# Patient Record
Sex: Female | Born: 1955 | Race: White | Hispanic: No | Smoking: Former smoker
Health system: Southern US, Community
[De-identification: ages and names within clinical notes are randomized; demographics above are authoritative.]

## PROBLEM LIST (undated history)

## (undated) DIAGNOSIS — I5030 Unspecified diastolic (congestive) heart failure: Secondary | ICD-10-CM

## (undated) DIAGNOSIS — M47814 Spondylosis without myelopathy or radiculopathy, thoracic region: Secondary | ICD-10-CM

## (undated) DIAGNOSIS — Z720 Tobacco use: Secondary | ICD-10-CM

## (undated) DIAGNOSIS — I2510 Atherosclerotic heart disease of native coronary artery without angina pectoris: Secondary | ICD-10-CM

## (undated) DIAGNOSIS — F32A Depression, unspecified: Secondary | ICD-10-CM

## (undated) DIAGNOSIS — R911 Solitary pulmonary nodule: Secondary | ICD-10-CM

## (undated) DIAGNOSIS — G2581 Restless legs syndrome: Secondary | ICD-10-CM

## (undated) DIAGNOSIS — F419 Anxiety disorder, unspecified: Secondary | ICD-10-CM

## (undated) DIAGNOSIS — I10 Essential (primary) hypertension: Secondary | ICD-10-CM

## (undated) DIAGNOSIS — G4700 Insomnia, unspecified: Secondary | ICD-10-CM

## (undated) DIAGNOSIS — I350 Nonrheumatic aortic (valve) stenosis: Secondary | ICD-10-CM

## (undated) DIAGNOSIS — I422 Other hypertrophic cardiomyopathy: Secondary | ICD-10-CM

## (undated) DIAGNOSIS — M797 Fibromyalgia: Secondary | ICD-10-CM

## (undated) DIAGNOSIS — J449 Chronic obstructive pulmonary disease, unspecified: Secondary | ICD-10-CM

## (undated) DIAGNOSIS — E119 Type 2 diabetes mellitus without complications: Secondary | ICD-10-CM

## (undated) DIAGNOSIS — I779 Disorder of arteries and arterioles, unspecified: Secondary | ICD-10-CM

## (undated) DIAGNOSIS — I639 Cerebral infarction, unspecified: Secondary | ICD-10-CM

## (undated) DIAGNOSIS — R569 Unspecified convulsions: Secondary | ICD-10-CM

## (undated) DIAGNOSIS — I2699 Other pulmonary embolism without acute cor pulmonale: Secondary | ICD-10-CM

## (undated) DIAGNOSIS — E785 Hyperlipidemia, unspecified: Secondary | ICD-10-CM

## (undated) HISTORY — PX: BACK SURGERY: SHX140

## (undated) HISTORY — PX: CHOLECYSTECTOMY: SHX55

## (undated) HISTORY — PX: TUBAL LIGATION: SHX77

---

## 2020-09-04 ENCOUNTER — Emergency Department: Payer: Medicare Other

## 2020-09-04 ENCOUNTER — Inpatient Hospital Stay
Admission: EM | Admit: 2020-09-04 | Discharge: 2020-09-08 | DRG: 070 | Disposition: A | Discharge status: Home Health | Payer: Medicare Other | Attending: Internal Medicine | Admitting: Internal Medicine

## 2020-09-04 ENCOUNTER — Other Ambulatory Visit: Payer: Self-pay

## 2020-09-04 DIAGNOSIS — R42 Dizziness and giddiness: Secondary | ICD-10-CM

## 2020-09-04 DIAGNOSIS — R4182 Altered mental status, unspecified: Secondary | ICD-10-CM

## 2020-09-04 DIAGNOSIS — Z888 Allergy status to other drugs, medicaments and biological substances status: Secondary | ICD-10-CM

## 2020-09-04 DIAGNOSIS — G9389 Other specified disorders of brain: Secondary | ICD-10-CM | POA: Diagnosis present

## 2020-09-04 DIAGNOSIS — J449 Chronic obstructive pulmonary disease, unspecified: Secondary | ICD-10-CM | POA: Diagnosis present

## 2020-09-04 DIAGNOSIS — F419 Anxiety disorder, unspecified: Secondary | ICD-10-CM | POA: Diagnosis present

## 2020-09-04 DIAGNOSIS — I6623 Occlusion and stenosis of bilateral posterior cerebral arteries: Secondary | ICD-10-CM | POA: Diagnosis present

## 2020-09-04 DIAGNOSIS — J9601 Acute respiratory failure with hypoxia: Secondary | ICD-10-CM | POA: Diagnosis present

## 2020-09-04 DIAGNOSIS — Z881 Allergy status to other antibiotic agents status: Secondary | ICD-10-CM

## 2020-09-04 DIAGNOSIS — I6601 Occlusion and stenosis of right middle cerebral artery: Secondary | ICD-10-CM | POA: Diagnosis present

## 2020-09-04 DIAGNOSIS — I110 Hypertensive heart disease with heart failure: Secondary | ICD-10-CM | POA: Diagnosis present

## 2020-09-04 DIAGNOSIS — Z7902 Long term (current) use of antithrombotics/antiplatelets: Secondary | ICD-10-CM

## 2020-09-04 DIAGNOSIS — I2510 Atherosclerotic heart disease of native coronary artery without angina pectoris: Secondary | ICD-10-CM | POA: Diagnosis present

## 2020-09-04 DIAGNOSIS — Z8249 Family history of ischemic heart disease and other diseases of the circulatory system: Secondary | ICD-10-CM

## 2020-09-04 DIAGNOSIS — G9340 Encephalopathy, unspecified: Secondary | ICD-10-CM | POA: Diagnosis present

## 2020-09-04 DIAGNOSIS — I5032 Chronic diastolic (congestive) heart failure: Secondary | ICD-10-CM | POA: Diagnosis present

## 2020-09-04 DIAGNOSIS — M797 Fibromyalgia: Secondary | ICD-10-CM | POA: Diagnosis present

## 2020-09-04 DIAGNOSIS — E669 Obesity, unspecified: Secondary | ICD-10-CM | POA: Diagnosis present

## 2020-09-04 DIAGNOSIS — Z86711 Personal history of pulmonary embolism: Secondary | ICD-10-CM

## 2020-09-04 DIAGNOSIS — Z833 Family history of diabetes mellitus: Secondary | ICD-10-CM

## 2020-09-04 DIAGNOSIS — I160 Hypertensive urgency: Secondary | ICD-10-CM | POA: Diagnosis present

## 2020-09-04 DIAGNOSIS — Z7982 Long term (current) use of aspirin: Secondary | ICD-10-CM

## 2020-09-04 DIAGNOSIS — Z8673 Personal history of transient ischemic attack (TIA), and cerebral infarction without residual deficits: Secondary | ICD-10-CM

## 2020-09-04 DIAGNOSIS — Z9049 Acquired absence of other specified parts of digestive tract: Secondary | ICD-10-CM

## 2020-09-04 DIAGNOSIS — G40909 Epilepsy, unspecified, not intractable, without status epilepticus: Secondary | ICD-10-CM | POA: Diagnosis present

## 2020-09-04 DIAGNOSIS — I422 Other hypertrophic cardiomyopathy: Secondary | ICD-10-CM | POA: Diagnosis present

## 2020-09-04 DIAGNOSIS — Z794 Long term (current) use of insulin: Secondary | ICD-10-CM

## 2020-09-04 DIAGNOSIS — R509 Fever, unspecified: Secondary | ICD-10-CM

## 2020-09-04 DIAGNOSIS — I10 Essential (primary) hypertension: Secondary | ICD-10-CM | POA: Diagnosis present

## 2020-09-04 DIAGNOSIS — G2581 Restless legs syndrome: Secondary | ICD-10-CM | POA: Diagnosis present

## 2020-09-04 DIAGNOSIS — Z20822 Contact with and (suspected) exposure to covid-19: Secondary | ICD-10-CM | POA: Diagnosis present

## 2020-09-04 DIAGNOSIS — G9341 Metabolic encephalopathy: Secondary | ICD-10-CM | POA: Diagnosis not present

## 2020-09-04 DIAGNOSIS — K5900 Constipation, unspecified: Secondary | ICD-10-CM | POA: Diagnosis not present

## 2020-09-04 DIAGNOSIS — G8929 Other chronic pain: Secondary | ICD-10-CM | POA: Diagnosis present

## 2020-09-04 DIAGNOSIS — R0600 Dyspnea, unspecified: Secondary | ICD-10-CM

## 2020-09-04 DIAGNOSIS — I639 Cerebral infarction, unspecified: Secondary | ICD-10-CM | POA: Diagnosis present

## 2020-09-04 DIAGNOSIS — R29810 Facial weakness: Secondary | ICD-10-CM | POA: Diagnosis present

## 2020-09-04 DIAGNOSIS — F32A Depression, unspecified: Secondary | ICD-10-CM | POA: Diagnosis present

## 2020-09-04 DIAGNOSIS — E119 Type 2 diabetes mellitus without complications: Secondary | ICD-10-CM

## 2020-09-04 DIAGNOSIS — R410 Disorientation, unspecified: Secondary | ICD-10-CM | POA: Diagnosis not present

## 2020-09-04 DIAGNOSIS — E785 Hyperlipidemia, unspecified: Secondary | ICD-10-CM | POA: Diagnosis present

## 2020-09-04 DIAGNOSIS — R079 Chest pain, unspecified: Secondary | ICD-10-CM | POA: Diagnosis not present

## 2020-09-04 DIAGNOSIS — Z885 Allergy status to narcotic agent status: Secondary | ICD-10-CM

## 2020-09-04 DIAGNOSIS — E1169 Type 2 diabetes mellitus with other specified complication: Secondary | ICD-10-CM

## 2020-09-04 DIAGNOSIS — Z87891 Personal history of nicotine dependence: Secondary | ICD-10-CM

## 2020-09-04 DIAGNOSIS — I472 Ventricular tachycardia: Secondary | ICD-10-CM | POA: Diagnosis present

## 2020-09-04 DIAGNOSIS — Z6831 Body mass index (BMI) 31.0-31.9, adult: Secondary | ICD-10-CM

## 2020-09-04 DIAGNOSIS — I350 Nonrheumatic aortic (valve) stenosis: Secondary | ICD-10-CM | POA: Diagnosis present

## 2020-09-04 DIAGNOSIS — E781 Pure hyperglyceridemia: Secondary | ICD-10-CM

## 2020-09-04 DIAGNOSIS — I493 Ventricular premature depolarization: Secondary | ICD-10-CM | POA: Diagnosis present

## 2020-09-04 DIAGNOSIS — M549 Dorsalgia, unspecified: Secondary | ICD-10-CM | POA: Diagnosis present

## 2020-09-04 DIAGNOSIS — R0989 Other specified symptoms and signs involving the circulatory and respiratory systems: Secondary | ICD-10-CM | POA: Diagnosis present

## 2020-09-04 DIAGNOSIS — R569 Unspecified convulsions: Secondary | ICD-10-CM

## 2020-09-04 HISTORY — DX: Tobacco use: Z72.0

## 2020-09-04 HISTORY — DX: Other pulmonary embolism without acute cor pulmonale: I26.99

## 2020-09-04 HISTORY — DX: Anxiety disorder, unspecified: F41.9

## 2020-09-04 HISTORY — DX: Type 2 diabetes mellitus without complications: E11.9

## 2020-09-04 HISTORY — DX: Chronic obstructive pulmonary disease, unspecified: J44.9

## 2020-09-04 HISTORY — DX: Fibromyalgia: M79.7

## 2020-09-04 HISTORY — DX: Solitary pulmonary nodule: R91.1

## 2020-09-04 HISTORY — DX: Unspecified diastolic (congestive) heart failure: I50.30

## 2020-09-04 HISTORY — DX: Spondylosis without myelopathy or radiculopathy, thoracic region: M47.814

## 2020-09-04 HISTORY — DX: Atherosclerotic heart disease of native coronary artery without angina pectoris: I25.10

## 2020-09-04 HISTORY — DX: Cerebral infarction, unspecified: I63.9

## 2020-09-04 HISTORY — DX: Essential (primary) hypertension: I10

## 2020-09-04 HISTORY — DX: Nonrheumatic aortic (valve) stenosis: I35.0

## 2020-09-04 HISTORY — DX: Hyperlipidemia, unspecified: E78.5

## 2020-09-04 HISTORY — DX: Other hypertrophic cardiomyopathy: I42.2

## 2020-09-04 HISTORY — DX: Unspecified convulsions: R56.9

## 2020-09-04 HISTORY — DX: Disorder of arteries and arterioles, unspecified: I77.9

## 2020-09-04 HISTORY — DX: Morbid (severe) obesity due to excess calories: E66.01

## 2020-09-04 HISTORY — DX: Depression, unspecified: F32.A

## 2020-09-04 HISTORY — DX: Restless legs syndrome: G25.81

## 2020-09-04 HISTORY — DX: Insomnia, unspecified: G47.00

## 2020-09-04 LAB — CBC
HCT: 40.5 % (ref 36.0–46.0)
Hemoglobin: 14.4 g/dL (ref 12.0–15.0)
MCH: 32.6 pg (ref 26.0–34.0)
MCHC: 35.6 g/dL (ref 30.0–36.0)
MCV: 91.6 fL (ref 80.0–100.0)
Platelets: 286 10*3/uL (ref 150–400)
RBC: 4.42 MIL/uL (ref 3.87–5.11)
RDW: 12.1 % (ref 11.5–15.5)
WBC: 7.8 10*3/uL (ref 4.0–10.5)
nRBC: 0 % (ref 0.0–0.2)

## 2020-09-04 LAB — TROPONIN I (HIGH SENSITIVITY)
Troponin I (High Sensitivity): 8 ng/L (ref <18)
Troponin I (High Sensitivity): 9 ng/L (ref <18)

## 2020-09-04 LAB — CBG MONITORING, ED
Glucose-Capillary: 121 mg/dL — ABNORMAL HIGH (ref 70–99)
Glucose-Capillary: 132 mg/dL — ABNORMAL HIGH (ref 70–99)

## 2020-09-04 LAB — DIFFERENTIAL
Abs Immature Granulocytes: 0.03 10*3/uL (ref 0.00–0.07)
Basophils Absolute: 0 10*3/uL (ref 0.0–0.1)
Basophils Relative: 1 %
Eosinophils Absolute: 0.1 10*3/uL (ref 0.0–0.5)
Eosinophils Relative: 2 %
Immature Granulocytes: 0 %
Lymphocytes Relative: 31 %
Lymphs Abs: 2.4 10*3/uL (ref 0.7–4.0)
Monocytes Absolute: 0.5 10*3/uL (ref 0.1–1.0)
Monocytes Relative: 7 %
Neutro Abs: 4.7 10*3/uL (ref 1.7–7.7)
Neutrophils Relative %: 59 %

## 2020-09-04 LAB — PROTIME-INR
INR: 1 (ref 0.8–1.2)
Prothrombin Time: 13.1 seconds (ref 11.4–15.2)

## 2020-09-04 LAB — COMPREHENSIVE METABOLIC PANEL
ALT: 27 U/L (ref 0–44)
AST: 23 U/L (ref 15–41)
Albumin: 3.9 g/dL (ref 3.5–5.0)
Alkaline Phosphatase: 38 U/L (ref 38–126)
Anion gap: 8 (ref 5–15)
BUN: 27 mg/dL — ABNORMAL HIGH (ref 8–23)
CO2: 29 mmol/L (ref 22–32)
Calcium: 9.7 mg/dL (ref 8.9–10.3)
Chloride: 102 mmol/L (ref 98–111)
Creatinine, Ser: 0.69 mg/dL (ref 0.44–1.00)
GFR, Estimated: >60 mL/min (ref >60)
Glucose, Bld: 193 mg/dL — ABNORMAL HIGH (ref 70–99)
Potassium: 4.3 mmol/L (ref 3.5–5.1)
Sodium: 139 mmol/L (ref 135–145)
Total Bilirubin: 0.7 mg/dL (ref 0.3–1.2)
Total Protein: 7.5 g/dL (ref 6.5–8.1)

## 2020-09-04 LAB — URINALYSIS, ROUTINE W REFLEX MICROSCOPIC
Bilirubin Urine: NEGATIVE
Glucose, UA: NEGATIVE mg/dL
Hgb urine dipstick: NEGATIVE
Ketones, ur: NEGATIVE mg/dL
Nitrite: NEGATIVE
Protein, ur: 100 mg/dL — AB
Specific Gravity, Urine: 1.027 (ref 1.005–1.030)
pH: 5 (ref 5.0–8.0)

## 2020-09-04 LAB — URINE DRUG SCREEN, QUALITATIVE (ARMC ONLY)
Amphetamines, Ur Screen: NOT DETECTED
Barbiturates, Ur Screen: NOT DETECTED
Benzodiazepine, Ur Scrn: NOT DETECTED
Cannabinoid 50 Ng, Ur ~~LOC~~: NOT DETECTED
Cocaine Metabolite,Ur ~~LOC~~: NOT DETECTED
MDMA (Ecstasy)Ur Screen: NOT DETECTED
Methadone Scn, Ur: NOT DETECTED
Opiate, Ur Screen: NOT DETECTED
Phencyclidine (PCP) Ur S: NOT DETECTED
Tricyclic, Ur Screen: POSITIVE — AB

## 2020-09-04 LAB — I-STAT CREATININE, ED: Creatinine, Ser: 0.8 mg/dL (ref 0.44–1.00)

## 2020-09-04 LAB — RESP PANEL BY RT-PCR (FLU A&B, COVID) ARPGX2
Influenza A by PCR: NEGATIVE
Influenza B by PCR: NEGATIVE
SARS Coronavirus 2 by RT PCR: NEGATIVE

## 2020-09-04 LAB — APTT: aPTT: 30 seconds (ref 24–36)

## 2020-09-04 LAB — ETHANOL: Alcohol, Ethyl (B): <10 mg/dL (ref <10)

## 2020-09-04 MED ORDER — ACETAMINOPHEN 650 MG RE SUPP: 650.0000 mg | Freq: Four times a day (QID) | RECTAL | Status: DC | PRN

## 2020-09-04 MED ORDER — ACETAMINOPHEN 325 MG PO TABS
650.0000 mg | ORAL_TABLET | Freq: Four times a day (QID) | ORAL | Status: DC | PRN
  Administered 2020-09-04 – 2020-09-07 (×3): 650 mg via ORAL
  Filled 2020-09-04 (×3): qty 2

## 2020-09-04 MED ORDER — ALBUTEROL SULFATE HFA 108 (90 BASE) MCG/ACT IN AERS
2.0000 | INHALATION_SPRAY | RESPIRATORY_TRACT | Status: DC | PRN
  Filled 2020-09-04: qty 6.7

## 2020-09-04 MED ORDER — MORPHINE SULFATE (PF) 2 MG/ML IV SOLN
2.0000 mg | INTRAVENOUS | Status: DC | PRN
  Administered 2020-09-04: 2 mg via INTRAVENOUS
  Filled 2020-09-04: qty 1

## 2020-09-04 MED ORDER — HYDRALAZINE HCL 20 MG/ML IJ SOLN
5.0000 mg | INTRAMUSCULAR | Status: DC | PRN
  Administered 2020-09-04 – 2020-09-05 (×3): 5 mg via INTRAVENOUS
  Filled 2020-09-04 (×3): qty 1

## 2020-09-04 MED ORDER — LORAZEPAM 2 MG/ML IJ SOLN: 1.0000 mg | INTRAMUSCULAR | Status: DC | PRN

## 2020-09-04 MED ORDER — KETOROLAC TROMETHAMINE 15 MG/ML IJ SOLN
15.0000 mg | Freq: Four times a day (QID) | INTRAMUSCULAR | Status: DC | PRN
  Administered 2020-09-04: 15 mg via INTRAVENOUS
  Filled 2020-09-04 (×4): qty 1

## 2020-09-04 MED ORDER — INSULIN ASPART 100 UNIT/ML IJ SOLN
0.0000 [IU] | Freq: Three times a day (TID) | INTRAMUSCULAR | Status: DC
  Administered 2020-09-04: 1 [IU] via SUBCUTANEOUS
  Administered 2020-09-05 (×2): 3 [IU] via SUBCUTANEOUS
  Administered 2020-09-05: 5 [IU] via SUBCUTANEOUS
  Administered 2020-09-06 (×2): 2 [IU] via SUBCUTANEOUS
  Administered 2020-09-06 – 2020-09-07 (×2): 3 [IU] via SUBCUTANEOUS
  Administered 2020-09-07: 2 [IU] via SUBCUTANEOUS
  Administered 2020-09-07: 1 [IU] via SUBCUTANEOUS
  Administered 2020-09-08 (×2): 2 [IU] via SUBCUTANEOUS
  Filled 2020-09-04 (×11): qty 1

## 2020-09-04 MED ORDER — LABETALOL HCL 5 MG/ML IV SOLN
20.0000 mg | INTRAVENOUS | Status: DC | PRN
  Administered 2020-09-04 – 2020-09-07 (×4): 20 mg via INTRAVENOUS
  Filled 2020-09-04 (×4): qty 4

## 2020-09-04 MED ORDER — INSULIN ASPART 100 UNIT/ML IJ SOLN
0.0000 [IU] | Freq: Every day | INTRAMUSCULAR | Status: DC
  Administered 2020-09-06: 2 [IU] via SUBCUTANEOUS
  Filled 2020-09-04: qty 1

## 2020-09-04 MED ORDER — ONDANSETRON HCL 4 MG/2ML IJ SOLN
4.0000 mg | Freq: Three times a day (TID) | INTRAMUSCULAR | Status: DC | PRN
  Administered 2020-09-05 (×2): 4 mg via INTRAVENOUS
  Filled 2020-09-04 (×2): qty 2

## 2020-09-04 NOTE — ED Provider Notes (Signed)
[facility] Emergency Department Provider Note ____________________________________________   Event Date/Time   First MD Initiated Contact with Patient 09/04/20 1355     (approximate)  I have reviewed the triage vital signs and the nursing notes.  HISTORY  Chief Complaint Code Stroke   HPI Maria Norris is a 65 y.o. femalewho presents to the ED for evaluation of code stroke  Chart review indicates no history in her system.  Patient is reportedly on Plavix and a history of TIA.  DM on insulin.  Daughter later arrives and provides majority of medical history.  She reports a history of stroke and TIA for the patient.  She is on aspirin and Plavix.  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  Majority of her care is through the [facility] healthcare system. Patient lives at home with her daughter for the past couple months.  Daughter reports that they were out to eat at [employer] today for Mother's Day when she suddenly started crying, saying she was confused and that she did not know where she was, she was shaking her bilateral hands and frantically looking around.  Reported a right-sided headache.  Daughter took the patient home and noted hypertensive blood pressures at home with systolics in the 190s. Daughter reports concern for stroke, due to patient's history of such, and so called 911.  EMS reports difficulty having the patient participate with examination, therefore called a stroke alert to our facility.  She presents to the ED as a stroke alert.  History limited upon arrival due to this.  Daughter reports checking blood pressures at home and they are frequently with systolics in the 190s and 200s.  Daughter reports that this is "normal" for the patient.  Daughter reports patient seemed fine yesterday and this morning prior to going to [employer].  No recent  illnesses.   Past Medical History:  Diagnosis Date  . Coronary artery disease   . Diabetes mellitus without complication (HCC)   . Hypertension   . Seizures (HCC)     There are no problems to display for this patient.   Prior to Admission medications   Not on File    Allergies Patient has no known allergies.  No family history on file.  Social History    Review of Systems  Unable to be accurately assessed upon arrival, but unable to acquire adequate history about an hour later after imaging has been completed and stroke alert canceled.  Constitutional: No fever/chills Eyes: No visual changes. ENT: No sore throat. Cardiovascular: Denies chest pain. Respiratory: Denies shortness of breath. Gastrointestinal: No abdominal pain.  No nausea, no vomiting.  No diarrhea.  No constipation. Genitourinary: Negative for dysuria. Musculoskeletal: Negative for back pain. Skin: Negative for rash. Neurological: Negative for headaches, focal weakness or numbness.  ____________________________________________   PHYSICAL EXAM:  VITAL SIGNS: Vitals:   09/04/20 1449 09/04/20 1500  BP:  (!) 195/67  Pulse:  61  Resp:  13  Temp: (!) 97.5 F (36.4 C)   SpO2:  96%     Constitutional: Alert and oriented. Well appearing and in no acute distress.  Supine in bed, slow to respond some questions, but answers questions appropriately and follows commands in all 4 extremities. Eyes: Conjunctivae are normal. PERRL. EOMI. Head: Atraumatic. Nose: No congestion/rhinnorhea. Mouth/Throat: Mucous membranes are moist.  Oropharynx non-erythematous. Neck: No stridor. No cervical spine tenderness to palpation. Cardiovascular: Normal rate, regular  rhythm. Grossly normal heart sounds.  Good peripheral circulation. Respiratory: Normal respiratory effort.  No retractions. Lungs CTAB. Gastrointestinal: Soft , nondistended, nontender to palpation. No CVA tenderness. Musculoskeletal: No lower extremity  tenderness nor edema.  No joint effusions. No signs of acute trauma. Neurologic:  Normal speech and language. No gross focal neurologic deficits are appreciated. Cranial nerves II through XII intact 5/5 strength and sensation in all 4 extremities Skin:  Skin is warm, dry and intact. No rash noted. Psychiatric: Mood and affect are normal. Speech and behavior are normal.  ____________________________________________   LABS (all labs ordered are listed, but only abnormal results are displayed)  Labs Reviewed  COMPREHENSIVE METABOLIC PANEL - Abnormal; Notable for the following components:      Result Value   Glucose, Bld 193 (*)    BUN 27 (*)    All other components within normal limits  RESP PANEL BY RT-PCR (FLU A&B, COVID) ARPGX2  PROTIME-INR  APTT  CBC  DIFFERENTIAL  ETHANOL  URINE DRUG SCREEN, QUALITATIVE ([facility] ONLY)  URINALYSIS, ROUTINE W REFLEX MICROSCOPIC  I-STAT CREATININE, ED   ____________________________________________  12 Lead EKG  Sinus rhythm, rate of 65 bpm.  Normal axis and normal intervals.  Some submillimeter upsloping ST elevation diffusely without reciprocal features.  No evidence of STEMI.  No comparison.  Likely stigmata of hypertension ____________________________________________  RADIOLOGY  ED MD interpretation: CT head reviewed by me without evidence of acute intracranial pathology.  Official radiology report(s): MR ANGIO HEAD WO CONTRAST  Result Date: 09/04/2020 CLINICAL DATA:  Sudden onset of confusion and occipital headache. EXAM: MRI HEAD WITHOUT CONTRAST MRA HEAD WITHOUT CONTRAST TECHNIQUE: Multiplanar, multiecho pulse sequences of the brain and surrounding structures were obtained without intravenous contrast. Angiographic images of the head were obtained using MRA technique without contrast. COMPARISON:  Head CT earlier same day FINDINGS: MRI HEAD FINDINGS Brain: Diffusion imaging does not show any acute or subacute infarction. There is old  infarction affecting the left side of the upper pons. No focal cerebellar finding. Cerebral hemispheres show old infarction in the deep white matter of the left hemisphere with some gliosis and volume loss. Few old small vessel infarctions in the right hemispheric white matter. No cortical or large vessel territory infarction. No mass lesion, hemorrhage, hydrocephalus or extra-axial collection. Minimal hemosiderin deposition in old left deep white matter infarction posteriorly. Vascular: Major vessels at the base of the brain show flow. Skull and upper cervical spine: Negative Sinuses/Orbits: Clear/normal Other: Bilateral mastoid effusions. MRA HEAD FINDINGS Both internal carotid arteries are widely patent through the skull base and siphon regions. The anterior and middle cerebral vessels are patent. There are stenoses at the right MCA bifurcation and proximal M2 segments which could place the patient at risk of MCA territory infarction on the right. Right vertebral artery is widely patent to the basilar. No antegrade flow in the left vertebral artery. No basilar stenosis. Flow is present within both superior cerebellar arteries and both posterior cerebral arteries. Narrowing and irregularity of the PCA branches, left worse than right. IMPRESSION: No acute or subacute brain infarction. 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 MR angiography does not large vessel anterior circulation occlusion. There are stenoses at the right MCA bifurcation, which could place the patient at risk of right MCA branch vessel infarction. No antegrade flow in the left vertebral artery. Atherosclerotic narrowing of the PCA branches, left more than right Electronically Signed   By: Loraine LericheMark  Shogry M.D.   On: 09/04/2020 14:40   MR BRAIN WO CONTRAST  Result Date: 09/04/2020 CLINICAL DATA:  Sudden onset of confusion and occipital headache. EXAM: MRI HEAD WITHOUT CONTRAST MRA HEAD WITHOUT CONTRAST  TECHNIQUE: Multiplanar, multiecho pulse sequences of the brain and surrounding structures were obtained without intravenous contrast. Angiographic images of the head were obtained using MRA technique without contrast. COMPARISON:  Head CT earlier same day FINDINGS: MRI HEAD FINDINGS Brain: Diffusion imaging does not show any acute or subacute infarction. There is old infarction affecting the left side of the upper pons. No focal cerebellar finding. Cerebral hemispheres show old infarction in the deep white matter of the left hemisphere with some gliosis and volume loss. Few old small vessel infarctions in the right hemispheric white matter. No cortical or large vessel territory infarction. No mass lesion, hemorrhage, hydrocephalus or extra-axial collection. Minimal hemosiderin deposition in old left deep white matter infarction posteriorly. Vascular: Major vessels at the base of the brain show flow. Skull and upper cervical spine: Negative Sinuses/Orbits: Clear/normal Other: Bilateral mastoid effusions. MRA HEAD FINDINGS Both internal carotid arteries are widely patent through the skull base and siphon regions. The anterior and middle cerebral vessels are patent. There are stenoses at the right MCA bifurcation and proximal M2 segments which could place the patient at risk of MCA territory infarction on the right. Right vertebral artery is widely patent to the basilar. No antegrade flow in the left vertebral artery. No basilar stenosis. Flow is present within both superior cerebellar arteries and both posterior cerebral arteries. Narrowing and irregularity of the PCA branches, left worse than right. IMPRESSION: No acute or subacute brain infarction. 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 MR angiography does not large vessel anterior circulation occlusion. There are stenoses at the right MCA bifurcation, which could place the patient at risk of right MCA branch vessel  infarction. No antegrade flow in the left vertebral artery. Atherosclerotic narrowing of the PCA branches, left more than right Electronically Signed   By: Paulina Fusi M.D.   On: 09/04/2020 14:40   CT HEAD CODE STROKE WO CONTRAST`  Result Date: 09/04/2020 CLINICAL DATA:  Code stroke. Neuro deficit, acute, stroke suspected. Sudden onset of headache. Personal history of stroke and TIA. EXAM: CT HEAD WITHOUT CONTRAST TECHNIQUE: Contiguous axial images were obtained from the base of the skull through the vertex without intravenous contrast. COMPARISON:  None. FINDINGS: Brain: Subcortical white matter changes are more prominent left than right. No acute infarct, hemorrhage, or mass lesion is present. Basal ganglia are intact. Insular ribbon is normal. No acute or focal cortical abnormality is present. Posterior fossa arachnoid cyst noted. The brainstem and cerebellum are otherwise normal. The ventricles are of normal size. No significant extraaxial fluid collection is present. Vascular: Atherosclerotic changes are present within the cavernous internal carotid arteries bilaterally. Calcifications are also present at the dural margin of the vertebral arteries. No hyperdense vessel is present. Skull: Calvarium is intact. No focal lytic or blastic lesions are present. No significant extracranial soft tissue lesion is present. Sinuses/Orbits: The paranasal sinuses and mastoid air cells are clear. The globes and orbits are within normal limits. ASPECTS [facility] Stroke Program Early CT Score) - Ganglionic level infarction (caudate, lentiform nuclei, internal capsule, insula, M1-M3 cortex): 7/7 - Supraganglionic infarction (M4-M6 cortex): 3/3 Total score (0-10 with 10 being normal): 10/10 IMPRESSION: 1. Subcortical white matter changes are more prominent left than right. This likely reflects  the sequela of chronic microvascular ischemia. 2. No acute intracranial abnormality. 3. ASPECTS is 10/10. These results were called by  telephone at the time of interpretation on 09/04/2020 at 2:07 pm to provider Dr. Iver Nestle , who verbally acknowledged these results. Electronically Signed   By: Marin Roberts M.D.   On: 09/04/2020 14:08    ____________________________________________   PROCEDURES and INTERVENTIONS  Procedure(s) performed (including Critical Care):  .1-3 Lead EKG Interpretation Performed by: Delton Prairie, MD Authorized by: Delton Prairie, MD     Interpretation: normal     ECG rate:  62   ECG rate assessment: normal     Rhythm: sinus rhythm     Ectopy: none     Conduction: normal      Medications - No data to display  ____________________________________________   MDM / ED COURSE   65 year old woman with history of stroke presents to the ED after resolving episode of transient altered mentation of uncertain etiology.  Presents hypertensive, but daughter reports these are typical blood pressures for the patient.  After imaging, I am able to more thoroughly assess the patient and I see no evidence of focal neurologic deficits, distress, trauma or any signs of vascular deficits.  Her MR and CT imaging are unremarkable, and without evidence of acute CVA.  Discussed the case with neurology, who recommends medical admission for observation, EEG.  Uncertain if this represents episode of transient global amnesia, TIA.  We will discussed the case with hospitalist for admission     ____________________________________________   FINAL CLINICAL IMPRESSION(S) / ED DIAGNOSES  Final diagnoses:  Altered mental status, unspecified altered mental status type     ED Discharge Orders    None       Breanda Greenlaw Katrinka Blazing   Note:  This document was prepared using Dragon voice recognition software and may include unintentional dictation errors.   Delton Prairie, MD 09/04/20 [phone]

## 2020-09-04 NOTE — ED Notes (Signed)
Patient noted to be resting comfortably, rise and fall of chest noted.  Sister bedside assisting with registration.

## 2020-09-04 NOTE — ED Triage Notes (Signed)
Pt was at [employer] with family when she complained of a sudden headache and weakness in her limbs- pt also had a sudden onset of confusion- pt has a hx of strokes

## 2020-09-04 NOTE — Progress Notes (Signed)
CODE STROKE- PHARMACY COMMUNICATION   Time CODE STROKE called/page received:1343  Time response to CODE STROKE was made (in person or via phone): 1359  Time Stroke Kit retrieved from [place] (only if needed): not required  Name of Provider/Nurse contacted:D Tamala Julian, MD  No past medical history on file. Prior to Admission medications   Not on [place] ,PharmD Clinical Pharmacist  09/04/2020  2:01 PM

## 2020-09-04 NOTE — ED Notes (Signed)
Report received from Ariel, RN 

## 2020-09-04 NOTE — Consult Note (Signed)
Neurology Consultation Reason for Consult: Code stroke for acute onset confusion and possible right-sided weakness Requesting Physician: Delton Prairie  CC: Confusion  History is obtained from: Sister at bedside, patient cannot be located in EMR at this time  HPI: Maria Norris is a 65 y.o. female with a past medical history significant for prior stroke, poorly controlled hypertension (baseline blood pressures at home typically in the 200s), reportedly controlled diabetes with A1c close to 7%, smoking history quit 1 week ago, likely hyperlipidemia, recent right carotid endarterectomy (September), chronic back pain and lower extremity pain s/p back surgery in 2017.  She was celebrating Mother's Day with her family and [employer] and had finished eating her meal.  She acutely started crying and was confused about where she was and reported she was not feeling well and was having a headache.  She had some anxious shaking of her bilateral upper extremities but no concern for seizure-like activity.  Family initially took the patient home and reported her blood pressure was 137/107 on home monitor.  Due to worsening confusion and reducing responsiveness they were planning to take the patient to [facility], where she has a neurologist, but she began to slump over and therefore they activated EMS.  Per EMS her blood pressure was 129/93 on their arrival, she was satting 94% on room air and her glucose was in 300s.  She was leaning towards the right when they were walking her towards their ambulance and oriented to self only.  Code stroke was activated for concern for focal right-sided weakness.  She has only recently moved in with her sister, and is awaiting a primary care physician appointment in the [facility] system in August  LKW: 1300 tPA given?: No, exam with low clinical concern for stroke, confirmed on imaging given patient's limited participation Premorbid modified rankin scale: 1-2     1 - No significant  disability. Able to carry out all usual activities, despite some symptoms.     2 - Slight disability. Able to look after own affairs without assistance, but unable to carry out all previous activities.   ROS: Unable to obtain due to altered mental status.  Per sister, no acute concerns other than described above  Past Medical History:  Diagnosis Date  . Coronary artery disease   . Diabetes mellitus without complication (HCC)   . Hypertension   . Seizures (HCC)    Please see history above as provided by sister  No family history on file. Unable to confirm with patient given her mental status  Social History:  has no history on file for tobacco use, alcohol use, and drug use. Known tobacco use  Exam: Current vital signs: BP (!) 199/74 (BP Location: Right Arm)   Pulse 66   Temp (!) 97.5 F (36.4 C) (Oral)   Resp 18   Ht 5\' 2"  (1.575 m)   Wt 79.2 kg   SpO2 97%   BMI 31.92 kg/m  Vital signs in last 24 hours: Temp:  [97.5 F (36.4 C)] 97.5 F (36.4 C) (05/08 1449) Pulse Rate:  [66] 66 (05/08 1445) Resp:  [18] 18 (05/08 1445) BP: (199)/(74) 199/74 (05/08 1445) SpO2:  [97 %] 97 % (05/08 1445) Weight:  [79.2 kg] 79.2 kg (05/08 1445)   Physical Exam  Constitutional: Appears well-developed and well-nourished.  Central obesity Psych: Anxious, hypersensitive to stimuli (for example crying out in pain from stickers being removed). Eyes: No scleral injection HENT: No oropharyngeal obstruction.  MSK: no joint deformities.  Cardiovascular: Normal rate and regular rhythm.  Respiratory: Effort normal, tachypneic GI: Soft.  No distension. There is no tenderness.  Skin: Warm dry and intact visible skin  Neuro: Mental Status: Patient is awake, alert, able to follow simple commands intermittently.  Able to name and repeat.  Knows she is 25 but does not answer what month or year it is.   Cranial Nerves: II: Visual Fields are full. Pupils are equal, round, and reactive to light.    III,IV, VI: EOMI without ptosis or diploplia.  No significant nystagmus V: Facial sensation is symmetric to eyelash brush VII: Facial movement is symmetric on repeat exam, initially possible slight right facial droop VIII: hearing is intact to voice X: Uvula elevates symmetrically XI: Unable to assess secondary to patient's mental status /participation XII: tongue is midline without atrophy or fasciculations.  Motor: Tone is normal. Bulk is normal.  There is no pronator drift in the bilateral upper extremities.  She initially was moving the right leg less than the left leg, but then later was spontaneously moving the right leg 2/5.  At best she would very briefly resist gravity before the leg the bed.  Overall both legs were fairly symmetric and seemed effort limited Sensory: Equally reactive to touch in all 4 extremities.  Generally was reporting feeling strange in response to sensation testing but not with any focality Deep Tendon Reflexes: 2+ and symmetric in the biceps and patellae.  Plantars: Toes are mute on the right and upgoing on left Cerebellar: Finger-nose is intact bilaterally in the upper extremities.  Patient would not participate with lower extremity testing  NIHSS total 7 Score breakdown: 1 point for right facial droop, 2 points for left leg drift, 2 points for right leg drift, one-point for dysarthria  I have reviewed labs in epic and the results pertinent to this consultation are: Creatinine 0.8 (i-STAT) CBC is reassuring and within normal limits CMP, UA and UDS pending at the time of this evaluation  I have reviewed the images obtained: Head CT negative for acute intracranial process no significant microvascular disease, worse on the left than the right MRI brain negative for acute intracranial process MRA with no flow in the left vertebral artery, scattered intracranial atherosclerosis  Impression: 65 year old presenting with an acute onset confusional episode.   Given her significant stroke risk factors advanced imaging was pursued to definitively rule out acute stroke.  Additional work-up per ED.  Overall, low concern for seizure at this time but if no other etiology of her symptoms is found TGA should be considered and would therefore recommend admission for observation and routine EEG on 5/9.  Recommendations: -MRI brain and MRA completed on my recommendations as above  -Follow-up pending labs above -Appreciate excellent supportive care per ED -Should work-up be otherwise unrevealing, please admit for observation/EEG  Please at the time of this evaluation, patient's family is still working on finding patient's full medication list and we have been unable to access this patient's records in the care everywhere EMR for corroboration.  Brooke Dare MD-PhD [facility] [phone] [facility] coverage for [facility] is from 8 AM to 4 AM in-house and 4 PM to 8 PM by telephone/video. 8 PM to 8 AM emergent questions or overnight urgent questions should be addressed to Teleneurology On-call or [facility] neurohospitalist; contact information can be found on AMION

## 2020-09-04 NOTE — H&P (Signed)
History and Physical    Karthika Glasper GNF:[id number] DOB: [birth year] DOA: 09/04/2020  Referring MD/NP/PA:   PCP: Pcp, No   Patient coming from:  The patient is coming from home.  At baseline, pt is independent for most of ADL.        Chief Complaint: AMS  HPI: Maria Norris is a 65 y.o. female with medical history significant of stroke, hypertension, hyperlipidemia, diabetes mellitus, stroke, childhood seizure (did not have seizure since age of 72), CAD, former smoker (quit smoking 1 week ago), bilateral carotid artery stenosis (s/p of right carotid endarterectomy 12/2019), chronic back pain, who presents with altered mental status.  Per patient's sister at the bedside, they were out to eat at [employer] today for Mother's Day when pt suddenly became confused, not knowing where she is and started crying, with decreased responsiveness. She was shaking bilateral hands, but did not seem to have seizure. She has right-sided headache. She was leaning toward right side when waling. He sister concerned for stroke, and so called 911.  EMS reports difficulty having the patient participate with examination, therefore called a stroke alert to our facility.   Per her sister, pt's blood pressure has been poorly controlled, SBP is frequently up to 190-200s.  Patient's mental status improved in ED.  When I saw patient, patient is alert, oriented x3.  Her mental status is back to baseline.  No unilateral numbness or tingling in extremities.  No facial droop or slurred speech.  Patient reports mild chest pain, which is located in the substernal area, dull, nonradiating.  Associated with mild shortness of breath earlier. No cough, fever or chills.  No nausea vomiting, diarrhea or abdominal pain.  No symptoms of UTI.  Patient states that she quit smoking about 1 week ago.  She does not need nicotine patch today.   ED Course: pt was found to have WBC 7.8, pending COVID-19 PCR, alcohol level less than 10,  electrolytes renal function okay, INR 1.0, PTT 30, temperature normal, blood pressure 199/74, heart rate is 86, RR 18, oxygen saturation 98% on room air.  CT head is negative for acute intracranial abnormalities.  MRI is negative for acute stroke, and it showed old left pons stroke.  MRA is negative for LVO.  Patient is placed on progressive plan for observation.  MRI/MRA: No acute or subacute brain infarction. 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  MR angiography does not large vessel anterior circulation occlusion. There are stenoses at the right MCA bifurcation, which could place the patient at risk of right MCA branch vessel infarction.  No antegrade flow in the left vertebral artery. Atherosclerotic narrowing of the PCA branches, left more than right   CT-head: 1. Subcortical white matter changes are more prominent left than right. This likely reflects the sequela of chronic microvascular ischemia. 2. No acute intracranial abnormality.  Review of Systems:   General: no fevers, chills, no body weight gain,  has fatigue HEENT: no blurry vision, hearing changes or sore throat Respiratory: has dyspnea, no coughing, wheezing CV: has chest pain, no palpitations GI: no nausea, vomiting, abdominal pain, diarrhea, constipation GU: no dysuria, burning on urination, increased urinary frequency, hematuria  Ext: no leg edema Neuro: no unilateral weakness, numbness, or tingling, no vision change or hearing loss. Has AMS. Skin: no rash, no skin tear. MSK: No muscle spasm, no deformity, no limitation of range of movement in spin Heme: No easy bruising.  Travel history: No recent long distant travel.  Allergy:  Allergies  Allergen Reactions  . Erythromycin Other (See Comments) and Rash    Unknown  Unknown    . Codeine   . Paroxetine   . Sertraline Other (See Comments)    Suicidal thoughts Suicidal thoughts   . Wellbutrin [Bupropion]    . Buspirone Rash    Past Medical History:  Diagnosis Date  . Coronary artery disease   . Diabetes mellitus without complication (HCC)   . HLD (hyperlipidemia)   . Hypertension   . Seizures (HCC)     Past Surgical History:  Procedure Laterality Date  . BACK SURGERY    . CHOLECYSTECTOMY    . TUBAL LIGATION      Social History:  reports that she has quit smoking. She has never used smokeless tobacco. She reports that she does not drink alcohol and does not use drugs.  Family History:  Family History  Problem Relation Age of Onset  . Diabetes Mellitus II Father   . CAD Father   . Diabetes Mellitus II Sister   . CAD Sister      Prior to Admission medications   Not on File    Physical Exam: Vitals:   09/04/20 1449 09/04/20 1500 09/04/20 1530 09/04/20 1600  BP:  (!) 195/67 (!) 173/65 (!) 174/60  Pulse:  61 63 (!) 59  Resp:  13 19 18   Temp: (!) 97.5 F (36.4 C)     TempSrc: Oral     SpO2:  96% 95% 96%  Weight:      Height:       General: Not in acute distress HEENT:       Eyes: PERRL, EOMI, no scleral icterus.       ENT: No discharge from the ears and nose, no pharynx injection, no tonsillar enlargement.        Neck: No JVD, no bruit, no mass felt. Heme: No neck lymph node enlargement. Cardiac: S1/S2, RRR, 2/6 systolic murmurs, No gallops or rubs. Respiratory: No rales, wheezing, rhonchi or rubs. GI: Soft, nondistended, nontender, no rebound pain, no organomegaly, BS present. GU: No hematuria Ext: No pitting leg edema bilaterally. 1+DP/PT pulse bilaterally. Musculoskeletal: No joint deformities, No joint redness or warmth, no limitation of ROM in spin. Skin: No rashes.  Neuro: Alert, oriented X3, cranial nerves II-XII grossly intact, moves all extremities normally. Psych: Patient is not psychotic, no suicidal or hemocidal ideation.  Labs on Admission: I have personally reviewed following labs and imaging studies  CBC: Recent Labs  Lab 09/04/20 1453  WBC  7.8  NEUTROABS 4.7  HGB 14.4  HCT 40.5  MCV 91.6  PLT 286   Basic Metabolic Panel: Recent Labs  Lab 09/04/20 1405 09/04/20 1453  NA  --  139  K  --  4.3  CL  --  102  CO2  --  29  GLUCOSE  --  193*  BUN  --  27*  CREATININE 0.80 0.69  CALCIUM  --  9.7   GFR: Estimated Creatinine Clearance: 70.1 mL/min (by C-G formula based on SCr of 0.69 mg/dL). Liver Function Tests: Recent Labs  Lab 09/04/20 1453  AST 23  ALT 27  ALKPHOS 38  BILITOT 0.7  PROT 7.5  ALBUMIN 3.9   No results for input(s): LIPASE, AMYLASE in the last 168 hours. No results for input(s): AMMONIA in the last 168 hours. Coagulation Profile: Recent Labs  Lab 09/04/20 1453  INR 1.0  Cardiac Enzymes: No results for input(s): CKTOTAL, CKMB, CKMBINDEX, TROPONINI in the last 168 hours. BNP (last 3 results) No results for input(s): PROBNP in the last 8760 hours. HbA1C: No results for input(s): HGBA1C in the last 72 hours. CBG: No results for input(s): GLUCAP in the last 168 hours. Lipid Profile: No results for input(s): CHOL, HDL, LDLCALC, TRIG, CHOLHDL, LDLDIRECT in the last 72 hours. Thyroid Function Tests: No results for input(s): TSH, T4TOTAL, FREET4, T3FREE, THYROIDAB in the last 72 hours. Anemia Panel: No results for input(s): VITAMINB12, FOLATE, FERRITIN, TIBC, IRON, RETICCTPCT in the last 72 hours. Urine analysis: No results found for: COLORURINE, APPEARANCEUR, LABSPEC, PHURINE, GLUCOSEU, HGBUR, BILIRUBINUR, KETONESUR, PROTEINUR, UROBILINOGEN, NITRITE, LEUKOCYTESUR Sepsis Labs: @LABRCNTIP (procalcitonin:4,lacticidven:4) ) Recent Results (from the past 240 hour(s))  Resp Panel by RT-PCR (Flu A&B, Covid) Nasopharyngeal Swab     Status: None   Collection Time: 09/04/20  2:53 PM   Specimen: Nasopharyngeal Swab; Nasopharyngeal(NP) swabs in vial transport medium  Result Value Ref Range Status   SARS Coronavirus 2 by RT PCR NEGATIVE NEGATIVE Final    Comment: (NOTE) SARS-CoV-2 target nucleic  acids are NOT DETECTED.  The SARS-CoV-2 RNA is generally detectable in upper respiratory specimens during the acute phase of infection. The lowest concentration of SARS-CoV-2 viral copies this assay can detect is 138 copies/mL. A negative result does not preclude SARS-Cov-2 infection and should not be used as the sole basis for treatment or other patient management decisions. A negative result may occur with  improper specimen collection/handling, submission of specimen other than nasopharyngeal swab, presence of viral mutation(s) within the areas targeted by this assay, and inadequate number of viral copies(<138 copies/mL). A negative result must be combined with clinical observations, patient history, and epidemiological information. The expected result is Negative.  Fact Sheet for Patients:  11/04/20  Fact Sheet for Healthcare Providers:  BloggerCourse.com  This test is no t yet approved or cleared by the [employer] FDA and  has been authorized for detection and/or diagnosis of SARS-CoV-2 by FDA under an Emergency Use Authorization (EUA). This EUA will remain  in effect (meaning this test can be used) for the duration of the COVID-19 declaration under Section 564(b)(1) of the Act, 21 U.S.C.section 360bbb-3(b)(1), unless the authorization is terminated  or revoked sooner.       Influenza A by PCR NEGATIVE NEGATIVE Final   Influenza B by PCR NEGATIVE NEGATIVE Final    Comment: (NOTE) The Xpert Xpress SARS-CoV-2/FLU/RSV plus assay is intended as an aid in the diagnosis of influenza from Nasopharyngeal swab specimens and should not be used as a sole basis for treatment. Nasal washings and aspirates are unacceptable for Xpert Xpress SARS-CoV-2/FLU/RSV testing.  Fact Sheet for Patients: [place]  Fact Sheet for Healthcare Providers: BloggerCourse.com  This  test is not yet approved or cleared by the [employer].[place] FDA and has been authorized for detection and/or diagnosis of SARS-CoV-2 by FDA under an Emergency Use Authorization (EUA). This EUA will remain in effect (meaning this test can be used) for the duration of the COVID-19 declaration under Section 564(b)(1) of the Act, 21 U.S.C. section 360bbb-3(b)(1), unless the authorization is terminated or revoked.  Performed at [facility], [address] [place], [place]      Radiological Exams on Admission: MR ANGIO HEAD WO CONTRAST  Result Date: 09/04/2020 CLINICAL DATA:  Sudden onset of confusion and occipital headache. EXAM: MRI HEAD WITHOUT CONTRAST MRA HEAD WITHOUT CONTRAST TECHNIQUE: Multiplanar, multiecho pulse sequences of the  brain and surrounding structures were obtained without intravenous contrast. Angiographic images of the head were obtained using MRA technique without contrast. COMPARISON:  Head CT earlier same day FINDINGS: MRI HEAD FINDINGS Brain: Diffusion imaging does not show any acute or subacute infarction. There is old infarction affecting the left side of the upper pons. No focal cerebellar finding. Cerebral hemispheres show old infarction in the deep white matter of the left hemisphere with some gliosis and volume loss. Few old small vessel infarctions in the right hemispheric white matter. No cortical or large vessel territory infarction. No mass lesion, hemorrhage, hydrocephalus or extra-axial collection. Minimal hemosiderin deposition in old left deep white matter infarction posteriorly. Vascular: Major vessels at the base of the brain show flow. Skull and upper cervical spine: Negative Sinuses/Orbits: Clear/normal Other: Bilateral mastoid effusions. MRA HEAD FINDINGS Both internal carotid arteries are widely patent through the skull base and siphon regions. The anterior and middle cerebral vessels are patent. There are stenoses at the right MCA bifurcation  and proximal M2 segments which could place the patient at risk of MCA territory infarction on the right. Right vertebral artery is widely patent to the basilar. No antegrade flow in the left vertebral artery. No basilar stenosis. Flow is present within both superior cerebellar arteries and both posterior cerebral arteries. Narrowing and irregularity of the PCA branches, left worse than right. IMPRESSION: No acute or subacute brain infarction. 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 MR angiography does not large vessel anterior circulation occlusion. There are stenoses at the right MCA bifurcation, which could place the patient at risk of right MCA branch vessel infarction. No antegrade flow in the left vertebral artery. Atherosclerotic narrowing of the PCA branches, left more than right Electronically Signed   By: Paulina Fusi M.D.   On: 09/04/2020 14:40   MR BRAIN WO CONTRAST  Result Date: 09/04/2020 CLINICAL DATA:  Sudden onset of confusion and occipital headache. EXAM: MRI HEAD WITHOUT CONTRAST MRA HEAD WITHOUT CONTRAST TECHNIQUE: Multiplanar, multiecho pulse sequences of the brain and surrounding structures were obtained without intravenous contrast. Angiographic images of the head were obtained using MRA technique without contrast. COMPARISON:  Head CT earlier same day FINDINGS: MRI HEAD FINDINGS Brain: Diffusion imaging does not show any acute or subacute infarction. There is old infarction affecting the left side of the upper pons. No focal cerebellar finding. Cerebral hemispheres show old infarction in the deep white matter of the left hemisphere with some gliosis and volume loss. Few old small vessel infarctions in the right hemispheric white matter. No cortical or large vessel territory infarction. No mass lesion, hemorrhage, hydrocephalus or extra-axial collection. Minimal hemosiderin deposition in old left deep white matter infarction posteriorly. Vascular:  Major vessels at the base of the brain show flow. Skull and upper cervical spine: Negative Sinuses/Orbits: Clear/normal Other: Bilateral mastoid effusions. MRA HEAD FINDINGS Both internal carotid arteries are widely patent through the skull base and siphon regions. The anterior and middle cerebral vessels are patent. There are stenoses at the right MCA bifurcation and proximal M2 segments which could place the patient at risk of MCA territory infarction on the right. Right vertebral artery is widely patent to the basilar. No antegrade flow in the left vertebral artery. No basilar stenosis. Flow is present within both superior cerebellar arteries and both posterior cerebral arteries. Narrowing and irregularity of the PCA branches, left worse than right. IMPRESSION: No acute or subacute brain infarction. Old infarction  in the left pons and in the deep white matter of the left hemisphere. MR angiography does not large vessel anterior circulation occlusion. There are stenoses at the right MCA bifurcation, which could place the patient at risk of right MCA branch vessel infarction. No antegrade flow in the left vertebral artery. Atherosclerotic narrowing of the PCA branches, left more than right Electronically Signed   By: Paulina Fusi M.D.   On: 09/04/2020 14:40   CT HEAD CODE STROKE WO CONTRAST`  Result Date: 09/04/2020 CLINICAL DATA:  Code stroke. Neuro deficit, acute, stroke suspected. Sudden onset of headache. Personal history of stroke and TIA. EXAM: CT HEAD WITHOUT CONTRAST TECHNIQUE: Contiguous axial images were obtained from the base of the skull through the vertex without intravenous contrast. COMPARISON:  None. FINDINGS: Brain: Subcortical white matter changes are more prominent left than right. No acute infarct, hemorrhage, or mass lesion is present. Basal ganglia are intact. Insular ribbon is normal. No acute or focal cortical abnormality is present. Posterior fossa arachnoid cyst noted. The brainstem and  cerebellum are otherwise normal. The ventricles are of normal size. No significant extraaxial fluid collection is present. Vascular: Atherosclerotic changes are present within the cavernous internal carotid arteries bilaterally. Calcifications are also present at the dural margin of the vertebral arteries. No hyperdense vessel is present. Skull: Calvarium is intact. No focal lytic or blastic lesions are present. No significant extracranial soft tissue lesion is present. Sinuses/Orbits: The paranasal sinuses and mastoid air cells are clear. The globes and orbits are within normal limits. ASPECTS [facility] Stroke Program Early CT Score) - Ganglionic level infarction (caudate, lentiform nuclei, internal capsule, insula, M1-M3 cortex): 7/7 - Supraganglionic infarction (M4-M6 cortex): 3/3 Total score (0-10 with 10 being normal): 10/10 IMPRESSION: 1. Subcortical white matter changes are more prominent left than right. This likely reflects the sequela of chronic microvascular ischemia. 2. No acute intracranial abnormality. 3. ASPECTS is 10/10. These results were called by telephone at the time of interpretation on 09/04/2020 at 2:07 pm to provider Dr. Iver Nestle , who verbally acknowledged these results. Electronically Signed   By: Marin Roberts M.D.   On: 09/04/2020 14:08     EKG: I have personally reviewed.  Sinus rhythm, QTC 476, low voltage, T wave inversion in lateral leads   Assessment/Plan Principal Problem:   Acute metabolic encephalopathy Active Problems:   Coronary artery disease   Diabetes mellitus without complication (HCC)   Hypertension   Seizures (HCC)   Stroke (HCC)   Hypertensive urgency   Chest pain   Acute metabolic encephalopathy: Etiology is not clear.  MRI negative for acute stroke and MRA negative for LVO.  Per neurologist, Dr. Iver Nestle, pt may have transient global amnesia.  He recommended to get EEG.  -Please progressive bed for position -Frequent neurochecks -EEG -Seizure  precaution -As needed Ativan for seizure  Hypertensive urgency and history of hypertension: Blood pressure 199/74 -IV hydralazine prn -Continue home medications: Bidil, clonidine, isordil, cozarr metoprolol, nifedioine  Coronary artery disease and chest pain: Possibly due to demand ischemia secondary to hypertensive urgency. -ASA, Lipitor -Trend troponin -Check A1c, FLP  Diabetes mellitus without complication [facility]): Patient states she is taking metformin and Lantus at home.  A1c 6.2 on 09/09/2015, no recent A1c available -SSI -Decrease Lantus dose from 12 unit daily to 9 unit daily -Follow-up A1c  Childhood seizure seizures (HCC): pt states that he did not have seizure sine age of 81. -pt is on keppra -Follow-up EEG  History of stroke [facility]):  -  Aspirin, Lipitor   DVT ppx:  SQ Lovenox Code Status: Full code Family Communication:   Yes, patient's sister at bed side Disposition Plan:  Anticipate discharge back to previous environment Consults called:  Dr. Iver Nestle of neuro Admission status and Level of care: Progressive Cardiac:   for obs    Status is: Observation  The patient remains OBS appropriate and will d/c before 2 midnights.  Dispo: The patient is from: Home              Anticipated d/c is to: Home              Patient currently is not medically stable to d/c.   Difficult to place patient No           Date of Service 09/04/2020    Lorretta Harp [facility]   If 7PM-7AM, please contact night-coverage [email or web address] 09/04/2020, 4:35 PM

## 2020-09-04 NOTE — ED Notes (Signed)
Patient given meal tray and diet sprite.

## 2020-09-04 NOTE — ED Notes (Signed)
CBG 301 

## 2020-09-04 NOTE — ED Notes (Signed)
Floor coverage MD informed of patient's BP remaining 220's/60's after receiving hydralazine.  New orders placed.

## 2020-09-04 NOTE — ED Notes (Signed)
Floor coverage MD messaged via secure chat by this RN requesting pain medication for patient. Patient is uncomfortable, with 10/10 pain.  New orders received, see MAR.

## 2020-09-05 ENCOUNTER — Observation Stay: Payer: Medicare Other

## 2020-09-05 ENCOUNTER — Inpatient Hospital Stay: Payer: Medicare Other

## 2020-09-05 ENCOUNTER — Observation Stay (HOSPITAL_COMMUNITY)
Admit: 2020-09-05 | Discharge: 2020-09-05 | Disposition: A | Discharge status: Home / Self Care | Payer: Medicare Other | Attending: Internal Medicine | Admitting: Internal Medicine

## 2020-09-05 DIAGNOSIS — R011 Cardiac murmur, unspecified: Secondary | ICD-10-CM

## 2020-09-05 DIAGNOSIS — I160 Hypertensive urgency: Secondary | ICD-10-CM | POA: Diagnosis present

## 2020-09-05 DIAGNOSIS — I110 Hypertensive heart disease with heart failure: Secondary | ICD-10-CM | POA: Diagnosis present

## 2020-09-05 DIAGNOSIS — R569 Unspecified convulsions: Secondary | ICD-10-CM | POA: Diagnosis not present

## 2020-09-05 DIAGNOSIS — I6623 Occlusion and stenosis of bilateral posterior cerebral arteries: Secondary | ICD-10-CM | POA: Diagnosis present

## 2020-09-05 DIAGNOSIS — I639 Cerebral infarction, unspecified: Secondary | ICD-10-CM | POA: Diagnosis not present

## 2020-09-05 DIAGNOSIS — I25118 Atherosclerotic heart disease of native coronary artery with other forms of angina pectoris: Secondary | ICD-10-CM | POA: Diagnosis not present

## 2020-09-05 DIAGNOSIS — I350 Nonrheumatic aortic (valve) stenosis: Secondary | ICD-10-CM | POA: Diagnosis present

## 2020-09-05 DIAGNOSIS — I2510 Atherosclerotic heart disease of native coronary artery without angina pectoris: Secondary | ICD-10-CM

## 2020-09-05 DIAGNOSIS — Z86711 Personal history of pulmonary embolism: Secondary | ICD-10-CM | POA: Diagnosis not present

## 2020-09-05 DIAGNOSIS — E785 Hyperlipidemia, unspecified: Secondary | ICD-10-CM | POA: Diagnosis present

## 2020-09-05 DIAGNOSIS — G9341 Metabolic encephalopathy: Secondary | ICD-10-CM | POA: Diagnosis present

## 2020-09-05 DIAGNOSIS — Z885 Allergy status to narcotic agent status: Secondary | ICD-10-CM | POA: Diagnosis not present

## 2020-09-05 DIAGNOSIS — R4182 Altered mental status, unspecified: Secondary | ICD-10-CM

## 2020-09-05 DIAGNOSIS — K5900 Constipation, unspecified: Secondary | ICD-10-CM | POA: Diagnosis not present

## 2020-09-05 DIAGNOSIS — Z8673 Personal history of transient ischemic attack (TIA), and cerebral infarction without residual deficits: Secondary | ICD-10-CM | POA: Diagnosis not present

## 2020-09-05 DIAGNOSIS — E781 Pure hyperglyceridemia: Secondary | ICD-10-CM | POA: Diagnosis present

## 2020-09-05 DIAGNOSIS — I472 Ventricular tachycardia: Secondary | ICD-10-CM | POA: Diagnosis present

## 2020-09-05 DIAGNOSIS — E119 Type 2 diabetes mellitus without complications: Secondary | ICD-10-CM | POA: Diagnosis not present

## 2020-09-05 DIAGNOSIS — R0600 Dyspnea, unspecified: Secondary | ICD-10-CM | POA: Diagnosis present

## 2020-09-05 DIAGNOSIS — J9601 Acute respiratory failure with hypoxia: Secondary | ICD-10-CM | POA: Diagnosis present

## 2020-09-05 DIAGNOSIS — G40909 Epilepsy, unspecified, not intractable, without status epilepticus: Secondary | ICD-10-CM | POA: Diagnosis present

## 2020-09-05 DIAGNOSIS — M797 Fibromyalgia: Secondary | ICD-10-CM | POA: Diagnosis present

## 2020-09-05 DIAGNOSIS — Z20822 Contact with and (suspected) exposure to covid-19: Secondary | ICD-10-CM | POA: Diagnosis present

## 2020-09-05 DIAGNOSIS — J449 Chronic obstructive pulmonary disease, unspecified: Secondary | ICD-10-CM | POA: Diagnosis present

## 2020-09-05 DIAGNOSIS — G9340 Encephalopathy, unspecified: Secondary | ICD-10-CM | POA: Diagnosis present

## 2020-09-05 DIAGNOSIS — I422 Other hypertrophic cardiomyopathy: Secondary | ICD-10-CM | POA: Diagnosis present

## 2020-09-05 DIAGNOSIS — I6601 Occlusion and stenosis of right middle cerebral artery: Secondary | ICD-10-CM | POA: Diagnosis present

## 2020-09-05 DIAGNOSIS — R072 Precordial pain: Secondary | ICD-10-CM | POA: Diagnosis not present

## 2020-09-05 DIAGNOSIS — I5032 Chronic diastolic (congestive) heart failure: Secondary | ICD-10-CM | POA: Diagnosis present

## 2020-09-05 DIAGNOSIS — G2581 Restless legs syndrome: Secondary | ICD-10-CM | POA: Diagnosis present

## 2020-09-05 DIAGNOSIS — I10 Essential (primary) hypertension: Secondary | ICD-10-CM | POA: Diagnosis not present

## 2020-09-05 DIAGNOSIS — Z881 Allergy status to other antibiotic agents status: Secondary | ICD-10-CM | POA: Diagnosis not present

## 2020-09-05 DIAGNOSIS — E1169 Type 2 diabetes mellitus with other specified complication: Secondary | ICD-10-CM | POA: Diagnosis present

## 2020-09-05 LAB — LIPID PANEL
Cholesterol: 255 mg/dL — ABNORMAL HIGH (ref 0–200)
HDL: 43 mg/dL (ref >40)
LDL Cholesterol: UNDETERMINED mg/dL (ref 0–99)
Total CHOL/HDL Ratio: 5.9 RATIO
Triglycerides: 677 mg/dL — ABNORMAL HIGH (ref <150)
VLDL: UNDETERMINED mg/dL (ref 0–40)

## 2020-09-05 LAB — ECHOCARDIOGRAM COMPLETE
AR max vel: 1.63 cm2
AV Area VTI: 1.52 cm2
AV Area mean vel: 1.59 cm2
AV Mean grad: 10 mmHg
AV Peak grad: 19.4 mmHg
Ao pk vel: 2.2 m/s
Area-P 1/2: 2.42 cm2
Calc EF: 53.8 %
Height: 62 in
MV VTI: 1.71 cm2
S' Lateral: 3.4 cm
Single Plane A2C EF: 52.1 %
Single Plane A4C EF: 58.2 %
Weight: 2792 oz

## 2020-09-05 LAB — TROPONIN I (HIGH SENSITIVITY): Troponin I (High Sensitivity): 9 ng/L (ref <18)

## 2020-09-05 LAB — BRAIN NATRIURETIC PEPTIDE: B Natriuretic Peptide: 280.4 pg/mL — ABNORMAL HIGH (ref 0.0–100.0)

## 2020-09-05 LAB — CBC
HCT: 42.4 % (ref 36.0–46.0)
Hemoglobin: 15.1 g/dL — ABNORMAL HIGH (ref 12.0–15.0)
MCH: 33.2 pg (ref 26.0–34.0)
MCHC: 35.6 g/dL (ref 30.0–36.0)
MCV: 93.2 fL (ref 80.0–100.0)
Platelets: 271 10*3/uL (ref 150–400)
RBC: 4.55 MIL/uL (ref 3.87–5.11)
RDW: 12.5 % (ref 11.5–15.5)
WBC: 16 10*3/uL — ABNORMAL HIGH (ref 4.0–10.5)
nRBC: 0 % (ref 0.0–0.2)

## 2020-09-05 LAB — BASIC METABOLIC PANEL
Anion gap: 11 (ref 5–15)
BUN: 35 mg/dL — ABNORMAL HIGH (ref 8–23)
CO2: 27 mmol/L (ref 22–32)
Calcium: 9.4 mg/dL (ref 8.9–10.3)
Chloride: 99 mmol/L (ref 98–111)
Creatinine, Ser: 0.63 mg/dL (ref 0.44–1.00)
GFR, Estimated: >60 mL/min (ref >60)
Glucose, Bld: 237 mg/dL — ABNORMAL HIGH (ref 70–99)
Potassium: 4.3 mmol/L (ref 3.5–5.1)
Sodium: 137 mmol/L (ref 135–145)

## 2020-09-05 LAB — HEMOGLOBIN A1C
Hgb A1c MFr Bld: 7.7 % — ABNORMAL HIGH (ref 4.8–5.6)
Mean Plasma Glucose: 174.29 mg/dL

## 2020-09-05 LAB — GLUCOSE, CAPILLARY
Glucose-Capillary: 204 mg/dL — ABNORMAL HIGH (ref 70–99)
Glucose-Capillary: 118 mg/dL — ABNORMAL HIGH (ref 70–99)

## 2020-09-05 LAB — FOLATE: Folate: 43 ng/mL (ref >5.9)

## 2020-09-05 LAB — LDL CHOLESTEROL, DIRECT: Direct LDL: 122.6 mg/dL — ABNORMAL HIGH (ref 0–99)

## 2020-09-05 LAB — VITAMIN B12: Vitamin B-12: 1370 pg/mL — ABNORMAL HIGH (ref 180–914)

## 2020-09-05 LAB — AMMONIA: Ammonia: <9 umol/L — ABNORMAL LOW (ref 9–35)

## 2020-09-05 LAB — CBG MONITORING, ED
Glucose-Capillary: 301 mg/dL — ABNORMAL HIGH (ref 70–99)
Glucose-Capillary: 229 mg/dL — ABNORMAL HIGH (ref 70–99)
Glucose-Capillary: 272 mg/dL — ABNORMAL HIGH (ref 70–99)

## 2020-09-05 LAB — HIV ANTIBODY (ROUTINE TESTING W REFLEX): HIV Screen 4th Generation wRfx: NONREACTIVE

## 2020-09-05 MED ORDER — ADULT MULTIVITAMIN W/MINERALS CH
1.0000 | ORAL_TABLET | Freq: Every day | ORAL | Status: DC
  Administered 2020-09-05 – 2020-09-08 (×4): 1 via ORAL
  Filled 2020-09-05 (×5): qty 1

## 2020-09-05 MED ORDER — CLONIDINE HCL 0.1 MG PO TABS
0.1000 mg | ORAL_TABLET | Freq: Two times a day (BID) | ORAL | Status: DC
  Administered 2020-09-05 – 2020-09-08 (×7): 0.1 mg via ORAL
  Filled 2020-09-05 (×7): qty 1

## 2020-09-05 MED ORDER — ISOSORBIDE DINITRATE 30 MG PO TABS: 30.0000 mg | ORAL_TABLET | Freq: Every day | ORAL | Status: DC

## 2020-09-05 MED ORDER — METOPROLOL TARTRATE 25 MG PO TABS
25.0000 mg | ORAL_TABLET | Freq: Two times a day (BID) | ORAL | Status: DC
  Administered 2020-09-05 – 2020-09-07 (×5): 25 mg via ORAL
  Filled 2020-09-05 (×5): qty 1

## 2020-09-05 MED ORDER — ISOSORBIDE MONONITRATE ER 30 MG PO TB24
30.0000 mg | ORAL_TABLET | Freq: Every day | ORAL | Status: DC
  Administered 2020-09-05 – 2020-09-08 (×4): 30 mg via ORAL
  Filled 2020-09-05 (×4): qty 1

## 2020-09-05 MED ORDER — CLONIDINE HCL 0.1 MG PO TABS
0.1000 mg | ORAL_TABLET | Freq: Four times a day (QID) | ORAL | Status: DC | PRN
  Administered 2020-09-05: 0.1 mg via ORAL
  Filled 2020-09-05: qty 1

## 2020-09-05 MED ORDER — LEVETIRACETAM IN NACL 1000 MG/100ML IV SOLN: 1000.0000 mg | Freq: Two times a day (BID) | INTRAVENOUS | Status: DC

## 2020-09-05 MED ORDER — LEVETIRACETAM 500 MG PO TABS
500.0000 mg | ORAL_TABLET | Freq: Two times a day (BID) | ORAL | Status: DC
  Administered 2020-09-05: 500 mg via ORAL
  Filled 2020-09-05: qty 1

## 2020-09-05 MED ORDER — EZETIMIBE 10 MG PO TABS
10.0000 mg | ORAL_TABLET | Freq: Every day | ORAL | Status: DC
  Administered 2020-09-05 – 2020-09-08 (×4): 10 mg via ORAL
  Filled 2020-09-05 (×4): qty 1

## 2020-09-05 MED ORDER — ISOSORB DINITRATE-HYDRALAZINE 20-37.5 MG PO TABS
1.0000 | ORAL_TABLET | Freq: Three times a day (TID) | ORAL | Status: DC
  Administered 2020-09-05 – 2020-09-07 (×8): 1 via ORAL
  Filled 2020-09-05 (×14): qty 1

## 2020-09-05 MED ORDER — CLONIDINE HCL 0.1 MG PO TABS: 0.1000 mg | ORAL_TABLET | Freq: Two times a day (BID) | ORAL | Status: DC

## 2020-09-05 MED ORDER — MAGNESIUM OXIDE -MG SUPPLEMENT 400 (240 MG) MG PO TABS
400.0000 mg | ORAL_TABLET | Freq: Every day | ORAL | Status: DC
  Administered 2020-09-05 – 2020-09-08 (×4): 400 mg via ORAL
  Filled 2020-09-05 (×4): qty 1

## 2020-09-05 MED ORDER — METOPROLOL TARTRATE 25 MG PO TABS: 25.0000 mg | ORAL_TABLET | Freq: Two times a day (BID) | ORAL | Status: DC

## 2020-09-05 MED ORDER — PANTOPRAZOLE SODIUM 40 MG PO TBEC
40.0000 mg | DELAYED_RELEASE_TABLET | Freq: Every day | ORAL | Status: DC
  Administered 2020-09-05 – 2020-09-08 (×4): 40 mg via ORAL
  Filled 2020-09-05 (×4): qty 1

## 2020-09-05 MED ORDER — INSULIN GLARGINE 100 UNIT/ML ~~LOC~~ SOLN
9.0000 [IU] | Freq: Every day | SUBCUTANEOUS | Status: DC
  Administered 2020-09-05: 9 [IU] via SUBCUTANEOUS
  Filled 2020-09-05 (×2): qty 0.09

## 2020-09-05 MED ORDER — HYDROMORPHONE HCL 1 MG/ML IJ SOLN
1.0000 mg | INTRAMUSCULAR | Status: DC | PRN
  Administered 2020-09-05 (×3): 1 mg via INTRAVENOUS
  Filled 2020-09-05 (×4): qty 1

## 2020-09-05 MED ORDER — NIFEDIPINE ER 60 MG PO TB24: 60.0000 mg | ORAL_TABLET | Freq: Every day | ORAL | Status: DC

## 2020-09-05 MED ORDER — NITROGLYCERIN 0.4 MG SL SUBL
0.4000 mg | SUBLINGUAL_TABLET | SUBLINGUAL | Status: DC | PRN
  Administered 2020-09-07 (×3): 0.4 mg via SUBLINGUAL
  Filled 2020-09-05: qty 1

## 2020-09-05 MED ORDER — SODIUM CHLORIDE 0.9 % IV SOLN
12.5000 mg | Freq: Four times a day (QID) | INTRAVENOUS | Status: DC | PRN
  Administered 2020-09-05: 12.5 mg via INTRAVENOUS
  Filled 2020-09-05 (×2): qty 0.5

## 2020-09-05 MED ORDER — VITAMIN B-12 100 MCG PO TABS
100.0000 ug | ORAL_TABLET | Freq: Every day | ORAL | Status: DC
  Administered 2020-09-05 – 2020-09-07 (×3): 100 ug via ORAL
  Filled 2020-09-05 (×5): qty 1

## 2020-09-05 MED ORDER — L-THEANINE 100 MG PO CAPS: 100.0000 mg | ORAL_CAPSULE | Freq: Every day | ORAL | Status: DC

## 2020-09-05 MED ORDER — ALBUTEROL SULFATE HFA 108 (90 BASE) MCG/ACT IN AERS: 1.0000 | INHALATION_SPRAY | Freq: Four times a day (QID) | RESPIRATORY_TRACT | Status: DC | PRN

## 2020-09-05 MED ORDER — SODIUM CHLORIDE 0.9 % IV SOLN
750.0000 mg | Freq: Two times a day (BID) | INTRAVENOUS | Status: DC
  Administered 2020-09-06 – 2020-09-08 (×5): 750 mg via INTRAVENOUS
  Filled 2020-09-05: qty 5
  Filled 2020-09-05 (×6): qty 7.5

## 2020-09-05 MED ORDER — ATORVASTATIN CALCIUM 20 MG PO TABS
40.0000 mg | ORAL_TABLET | Freq: Every day | ORAL | Status: DC
  Administered 2020-09-05 – 2020-09-08 (×4): 40 mg via ORAL
  Filled 2020-09-05 (×4): qty 2

## 2020-09-05 MED ORDER — ASPIRIN EC 325 MG PO TBEC
325.0000 mg | DELAYED_RELEASE_TABLET | Freq: Every day | ORAL | Status: DC
  Administered 2020-09-05 – 2020-09-08 (×4): 325 mg via ORAL
  Filled 2020-09-05 (×4): qty 1

## 2020-09-05 MED ORDER — LOSARTAN POTASSIUM 50 MG PO TABS: 100.0000 mg | ORAL_TABLET | Freq: Every day | ORAL | Status: DC

## 2020-09-05 MED ORDER — LOSARTAN POTASSIUM 50 MG PO TABS
100.0000 mg | ORAL_TABLET | Freq: Every day | ORAL | Status: DC
  Administered 2020-09-05 – 2020-09-08 (×4): 100 mg via ORAL
  Filled 2020-09-05 (×4): qty 2

## 2020-09-05 MED ORDER — LEVETIRACETAM IN NACL 1000 MG/100ML IV SOLN
1000.0000 mg | Freq: Once | INTRAVENOUS | Status: AC
  Administered 2020-09-05: 1000 mg via INTRAVENOUS
  Filled 2020-09-05: qty 100

## 2020-09-05 MED ORDER — ENOXAPARIN SODIUM 40 MG/0.4ML IJ SOSY
40.0000 mg | PREFILLED_SYRINGE | INTRAMUSCULAR | Status: DC
  Administered 2020-09-05 – 2020-09-07 (×3): 40 mg via SUBCUTANEOUS
  Filled 2020-09-05 (×3): qty 0.4

## 2020-09-05 MED ORDER — RINGERS IV SOLN
INTRAVENOUS | Status: AC
  Filled 2020-09-05 (×2): qty 1000

## 2020-09-05 MED ORDER — GABAPENTIN 600 MG PO TABS
1200.0000 mg | ORAL_TABLET | Freq: Four times a day (QID) | ORAL | Status: DC
  Administered 2020-09-05 – 2020-09-06 (×7): 1200 mg via ORAL
  Filled 2020-09-05 (×7): qty 2

## 2020-09-05 MED ORDER — NIFEDIPINE ER 60 MG PO TB24
60.0000 mg | ORAL_TABLET | Freq: Every day | ORAL | Status: DC
  Administered 2020-09-05 – 2020-09-07 (×3): 60 mg via ORAL
  Filled 2020-09-05 (×5): qty 1

## 2020-09-05 MED ORDER — HYDRALAZINE HCL 20 MG/ML IJ SOLN
10.0000 mg | Freq: Four times a day (QID) | INTRAMUSCULAR | Status: DC | PRN
  Administered 2020-09-05 – 2020-09-07 (×3): 10 mg via INTRAVENOUS
  Filled 2020-09-05 (×2): qty 1

## 2020-09-05 NOTE — Plan of Care (Signed)

## 2020-09-05 NOTE — Procedures (Signed)
Routine EEG Report  Maria Norris is a 65 y.o. female with a history of remote multifocal ischemic infarcts and seizures who is undergoing an EEG to evaluate for seizures.  Report: This EEG was acquired with electrodes placed according to the International 10-20 electrode system (including Fp1, Fp2, F3, F4, C3, C4, P3, P4, O1, O2, T3, T4, T5, T6, A1, A2, Fz, Cz, Pz). The following electrodes were missing or displaced: none.  The occipital dominant rhythm was 6-7 Hz with overriding faster frequencies in the beta range throughout the recording. This activity is reactive to stimulation. Drowsiness was manifested by background fragmentation; deeper stages of sleep were characterized by sleep spindles and K complexes. There was occasional slowing over the L temporo-parietal region. There were occasional sharply contoured discharges in the left temporo-parietal region that did not disrupt the background, did not have a significant field, and were not definitively epileptiform. There were no electrographic seizures identified. There was no abnormal response to photic stimulation or hyperventilation.   Impression & clinical correlation:   This EEG was obtained while awake and asleep and is abnormal due to mild diffuse slowing with superimposed focal L temporo-parietal slowing, indicating global cerebral dysfunction as well as focal dysfunction in the site of prior ischemic damage.   There were occasional sharply contoured discharges in the left temporo-parietal region that did not disrupt the background, did not have a significant field, and were not definitively epileptiform. There were no electrographic seizures identified. If further characterization would be helpful clinically, consider prolonged EEG monitoring with video.    Bing Neighbors, MD [facility] [phone]  If 7pm- 7am, please page neurology on call as listed in AMION.

## 2020-09-05 NOTE — Progress Notes (Signed)
PHARMACIST - PHYSICIAN ORDER COMMUNICATION  CONCERNING: P&T Medication Policy on Herbal Medications  DESCRIPTION:  This patient's order for:  L-Theanine CAPS 100 mg  has been noted.  This product(s) is classified as an "herbal" or natural product. Due to a lack of definitive safety studies or FDA approval, nonstandard manufacturing practices, plus the potential risk of unknown drug-drug interactions while on inpatient medications, the Pharmacy and Therapeutics Committee does not permit the use of "herbal" or natural products of this type within [facility].   ACTION TAKEN: The pharmacy department is unable to verify this order at this time. Please reevaluate patient's clinical condition at discharge and address if the herbal or natural product(s) should be resumed at that time.  Otelia Sergeant, PharmD, [facility] 09/05/2020 4:08 AM

## 2020-09-05 NOTE — ED Notes (Signed)
MD notified of patient's BP 229/67 after 20mg  of labetalol & patient c/o head pain 8/10. New orders given.

## 2020-09-05 NOTE — ED Notes (Signed)
Breakfast given at this time.  

## 2020-09-05 NOTE — Progress Notes (Signed)
*  PRELIMINARY RESULTS* Echocardiogram 2D Echocardiogram has been performed.  Joanette Gula Taejon Irani 09/05/2020, 2:25 PM

## 2020-09-05 NOTE — Progress Notes (Addendum)
[facility]                                                                              Patient Demographics  Maria Norris, is a 65 y.o. female, DOB - [birth year], OJJ:[id number]  Admit date - 09/04/2020   Admitting Physician Maria Harp, MD  Outpatient Primary MD for the patient is Maria Norris  Outpatient specialists:   LOS - 0  days   Medical records reviewed and are as summarized below:    Chief Complaint  Patient presents with  . Code Stroke       Brief summary   Patient is a 65 year old female with prior history of CVA, hypertension, hyperlipidemia, diabetes mellitus, childhood seizure (did not have seizures since the age of 24), CAD, former smoker, quit smoking 1 week ago, bilateral carotid artery stenosis status post right carotid endarterectomy in 12/2019, chronic back pain presented with altered mental status. Patient sister at the bedside reported that they were out to eat at [employer] for Mother's Day when patient suddenly became confused not knowing where she is and started crying with decreased responsiveness.  Patient was shaking bilateral hands but did not seem to have seizure.  Also had right-sided headache and leaning towards right side when walking.  EMS was called. Per sister, BP has been poorly controlled and frequently up to 190s to 200s.  Patient's mental status improved in ED, back to baseline.  Norris focal weakness, slurred speech or facial droop.  Mild shortness of breath. CT head was negative for acute intracranial abnormalities, MRI, MRA negative for acute stroke. Neurology was consulted. BP 230/71 in ED, heart rate 60s Patient was admitted for acute/transient metabolic encephalopathy with hypertensive urgency   Assessment & Plan    Principal Problem:   Acute metabolic encephalopathy -Transient, possibly due to acute hypertensive urgency versus seizure.  BP in 200s in ED  -Currently alert and oriented x3, neurology  consulted. -MRI negative for acute stroke, MRA negative for LVOT -Neurology recommended EEG.  Keppra Addendum 1:55pm Notified by RN regarding mental status changes.  Examined the patient again, does not appear to have acute focal weakness however acting differently from my examination in the morning, slurred speech, states she is seeing double. -UA was checked last night was negative for UTI, will check ammonia level, order CT head.  Does not drink alcohol.  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  -Neurology evaluation (notified Maria Norris)   Active Problems: Hypertensive urgency -With a history of uncontrolled hypertension.  BP has been consistently elevated and systolic readings in 200s -Resumed BiDil, clonidine, Cozaar, metoprolol, nifedipine -BP improving  Mild acute respiratory failure with hypoxia -In ED, sats dropped to 89%, placed on 2 L, sats improved to 97% -BNP, chest x-ray for further work-up    Coronary artery disease -Chest pain improving,, possibly due to demand ischemia secondary to hypertensive urgency -Lipid panel showed triglycerides of 677, cholesterol 255, unable to calculate LDL -Continue Lipitor 40 mg daily, add Zetia -Will need close outpatient follow-up with PCP/cardiology and repeat lipid panel in 4-week -Troponin negative, flat -  will follow 2D echo  Diabetes mellitus without complication (HCC), NIDDM, underlying history of CAD, uncontrolled with hyperglycemia -Obtain hemoglobin A1c, placed on sliding scale insulin while inpatient -Hold metformin  Obesity Estimated body mass index is 31.92 kg/m as calculated from the following:   Height as of this encounter:  (1.575 m).   Weight as of this encounter: 79.2 kg.  Code Status: Full CODE STATUS DVT Prophylaxis:  enoxaparin (LOVENOX) injection 40 mg Start: 09/05/20 0800   Level of Care: Level of care: Progressive Cardiac Family Communication: Discussed all imaging results, lab results, explained to  patient's sister, Maria Norris on phone [phone].     Disposition Plan:     Status is: Inpatient   Inpatient level of care appropriate due to severity of illness  Dispo: The patient is from: Home              Anticipated d/c is to: Home              Patient currently is not medically stable to d/c.  Uncontrolled BP, hypertensive urgency, confusion    Difficult to place patient Norris      Time Spent in minutes 35 minutes  Procedures:  MRI, MRI brain  Consultants:   Neurology  Antimicrobials:   Anti-infectives (From admission, onward)   None          Medications  Scheduled Meds: . aspirin  325 mg Oral Daily  . atorvastatin  40 mg Oral Daily  . cloNIDine  0.1 mg Oral BID  . enoxaparin (LOVENOX) injection  40 mg Subcutaneous Q24H  . gabapentin  1,200 mg Oral Q6H  . insulin aspart  0-5 Units Subcutaneous QHS  . insulin aspart  0-9 Units Subcutaneous TID WC  . insulin glargine  9 Units Subcutaneous QHS  . isosorbide mononitrate  30 mg Oral Daily  . isosorbide-hydrALAZINE  1 tablet Oral TID  . levETIRAcetam  500 mg Oral BID  . losartan  100 mg Oral Daily  . magnesium oxide  400 mg Oral Daily  . metoprolol tartrate  25 mg Oral BID  . multivitamin with minerals  1 tablet Oral Daily  . NIFEdipine  60 mg Oral Daily  . pantoprazole  40 mg Oral Daily  . vitamin B-12  100 mcg Oral Daily   Continuous Infusions: . promethazine (PHENERGAN) injection (IM or IVPB) Stopped (09/05/20 0908)   PRN Meds:.acetaminophen, acetaminophen, albuterol, cloNIDine, hydrALAZINE, HYDROmorphone (DILAUDID) injection, ketorolac, labetalol, LORazepam, nitroGLYCERIN, ondansetron (ZOFRAN) IV, promethazine (PHENERGAN) injection (IM or IVPB)      Subjective:   Maria Norris was seen and examined today.  BP still uncontrolled.  Chest pain improving.  Currently alert and oriented. Patient denies dizziness, shortness of breath, abdominal pain, N/V/D/C, new weakness, numbess, tingling.    Objective:   Vitals:   09/05/20 0930 09/05/20 0945 09/05/20 1000 09/05/20 1015  BP: (!) 190/147 (!) 185/44 (!) 173/94 (!) 165/59  Pulse: 85 83 84 83  Resp: Temp:      TempSrc:      SpO2: 92% 93% 90% 91%  Weight:      Height:        Intake/Output Summary (Last 24 hours) at 09/05/2020 1036 Last data filed at 09/05/2020 0908 Gross per 24 hour  Intake 50 ml  Output 350 ml  Net -300 ml     Wt Readings from Last 3 Encounters:  09/04/20 79.2 kg     Exam  General: Alert and oriented x 3, NAD  Cardiovascular:  S1 S2 auscultated, 2/6 murmur, RRR  Respiratory: Clear to auscultation bilaterally  Gastrointestinal: Soft, nontender, nondistended, + bowel sounds  Ext: Norris pedal edema bilaterally  Neuro: Norris new deficits  Musculoskeletal: Norris digital cyanosis, clubbing  Skin: Norris rashes  Psych: Normal affect and demeanor, alert and oriented x3    Data Reviewed:  I have personally reviewed following labs and imaging studies  Micro Results Recent Results (from the past 240 hour(s))  Resp Panel by RT-PCR (Flu A&B, Covid) Nasopharyngeal Swab     Status: None   Collection Time: 09/04/20  2:53 PM   Specimen: Nasopharyngeal Swab; Nasopharyngeal(NP) swabs in vial transport medium  Result Value Ref Range Status   SARS Coronavirus 2 by RT PCR NEGATIVE NEGATIVE Final    Comment: (NOTE) SARS-CoV-2 target nucleic acids are NOT DETECTED.  The SARS-CoV-2 RNA is generally detectable in upper respiratory specimens during the acute phase of infection. The lowest concentration of SARS-CoV-2 viral copies this assay can detect is 138 copies/mL. A negative result does not preclude SARS-Cov-2 infection and should not be used as the sole basis for treatment or other patient management decisions. A negative result may occur with  improper specimen collection/handling, submission of specimen other than nasopharyngeal swab, presence of viral mutation(s) within the areas targeted by  this assay, and inadequate number of viral copies(<138 copies/mL). A negative result must be combined with clinical observations, patient history, and epidemiological information. The expected result is Negative.  Fact Sheet for Patients:  BloggerCourse.com  Fact Sheet for Healthcare Providers:  SeriousBroker.it  This test is Norris t yet approved or cleared by the [place] FDA and  has been authorized for detection and/or diagnosis of SARS-CoV-2 by FDA under an Emergency Use Authorization (EUA). This EUA will remain  in effect (meaning this test can be used) for the duration of the COVID-19 declaration under Section 564(b)(1) of the Act, 21 U.S.C.section 360bbb-3(b)(1), unless the authorization is terminated  or revoked sooner.       Influenza A by PCR NEGATIVE NEGATIVE Final   Influenza B by PCR NEGATIVE NEGATIVE Final    Comment: (NOTE) The Xpert Xpress SARS-CoV-2/FLU/RSV plus assay is intended as an aid in the diagnosis of influenza from Nasopharyngeal swab specimens and should not be used as a sole basis for treatment. Nasal washings and aspirates are unacceptable for Xpert Xpress SARS-CoV-2/FLU/RSV testing.  Fact Sheet for Patients: BloggerCourse.com  Fact Sheet for Healthcare Providers: SeriousBroker.it  This test is not yet approved or cleared by the [place] FDA and has been authorized for detection and/or diagnosis of SARS-CoV-2 by FDA under an Emergency Use Authorization (EUA). This EUA will remain in effect (meaning this test can be used) for the duration of the COVID-19 declaration under Section 564(b)(1) of the Act, 21 U.S.C. section 360bbb-3(b)(1), unless the authorization is terminated or revoked.  Performed at [facility], [address] [place], [place] [address]     Radiology Reports MR ANGIO HEAD WO CONTRAST  Result Date:  09/04/2020 CLINICAL DATA:  Sudden onset of confusion and occipital headache. EXAM: MRI HEAD WITHOUT CONTRAST MRA HEAD WITHOUT CONTRAST TECHNIQUE: Multiplanar, multiecho pulse sequences of the brain and surrounding structures were obtained without intravenous contrast. Angiographic images of the head were obtained using MRA technique without contrast. COMPARISON:  Head CT earlier same day FINDINGS: MRI HEAD FINDINGS Brain: Diffusion imaging does not show any acute or subacute infarction. There is old infarction affecting the left side of the upper pons. Norris focal cerebellar  finding. Cerebral hemispheres show old infarction in the deep white matter of the left hemisphere with some gliosis and volume loss. Few old small vessel infarctions in the right hemispheric white matter. Norris cortical or large vessel territory infarction. Norris mass lesion, hemorrhage, hydrocephalus or extra-axial collection. Minimal hemosiderin deposition in old left deep white matter infarction posteriorly. Vascular: Major vessels at the base of the brain show flow. Skull and upper cervical spine: Negative Sinuses/Orbits: Clear/normal Other: Bilateral mastoid effusions. MRA HEAD FINDINGS Both internal carotid arteries are widely patent through the skull base and siphon regions. The anterior and middle cerebral vessels are patent. There are stenoses at the right MCA bifurcation and proximal M2 segments which could place the patient at risk of MCA territory infarction on the right. Right vertebral artery is widely patent to the basilar. Norris antegrade flow in the left vertebral artery. Norris basilar stenosis. Flow is present within both superior cerebellar arteries and both posterior cerebral arteries. Narrowing and irregularity of the PCA branches, left worse than right. IMPRESSION: Norris acute or subacute brain infarction. 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 MR angiography does not large vessel anterior  circulation occlusion. There are stenoses at the right MCA bifurcation, which could place the patient at risk of right MCA branch vessel infarction. Norris antegrade flow in the left vertebral artery. Atherosclerotic narrowing of the PCA branches, left more than right Electronically Signed   By: Paulina Fusi M.D.   On: 09/04/2020 14:40   MR BRAIN WO CONTRAST  Result Date: 09/04/2020 CLINICAL DATA:  Sudden onset of confusion and occipital headache. EXAM: MRI HEAD WITHOUT CONTRAST MRA HEAD WITHOUT CONTRAST TECHNIQUE: Multiplanar, multiecho pulse sequences of the brain and surrounding structures were obtained without intravenous contrast. Angiographic images of the head were obtained using MRA technique without contrast. COMPARISON:  Head CT earlier same day FINDINGS: MRI HEAD FINDINGS Brain: Diffusion imaging does not show any acute or subacute infarction. There is old infarction affecting the left side of the upper pons. Norris focal cerebellar finding. Cerebral hemispheres show old infarction in the deep white matter of the left hemisphere with some gliosis and volume loss. Few old small vessel infarctions in the right hemispheric white matter. Norris cortical or large vessel territory infarction. Norris mass lesion, hemorrhage, hydrocephalus or extra-axial collection. Minimal hemosiderin deposition in old left deep white matter infarction posteriorly. Vascular: Major vessels at the base of the brain show flow. Skull and upper cervical spine: Negative Sinuses/Orbits: Clear/normal Other: Bilateral mastoid effusions. MRA HEAD FINDINGS Both internal carotid arteries are widely patent through the skull base and siphon regions. The anterior and middle cerebral vessels are patent. There are stenoses at the right MCA bifurcation and proximal M2 segments which could place the patient at risk of MCA territory infarction on the right. Right vertebral artery is widely patent to the basilar. Norris antegrade flow in the left vertebral artery.  Norris basilar stenosis. Flow is present within both superior cerebellar arteries and both posterior cerebral arteries. Narrowing and irregularity of the PCA branches, left worse than right. IMPRESSION: Norris acute or subacute brain infarction. 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 MR angiography does not large vessel anterior circulation occlusion. There are stenoses at the right MCA bifurcation, which could place the patient at risk of right MCA branch vessel infarction. Norris antegrade flow in the left vertebral artery. Atherosclerotic narrowing of the PCA branches,  left more than right Electronically Signed   By: Paulina FusiMark  Shogry M.D.   On: 09/04/2020 14:40   CT HEAD CODE STROKE WO CONTRAST`  Result Date: 09/04/2020 CLINICAL DATA:  Code stroke. Neuro deficit, acute, stroke suspected. Sudden onset of headache. Personal history of stroke and TIA. EXAM: CT HEAD WITHOUT CONTRAST TECHNIQUE: Contiguous axial images were obtained from the base of the skull through the vertex without intravenous contrast. COMPARISON:  None. FINDINGS: Brain: Subcortical white matter changes are more prominent left than right. Norris acute infarct, hemorrhage, or mass lesion is present. Basal ganglia are intact. Insular ribbon is normal. Norris acute or focal cortical abnormality is present. Posterior fossa arachnoid cyst noted. The brainstem and cerebellum are otherwise normal. The ventricles are of normal size. Norris significant extraaxial fluid collection is present. Vascular: Atherosclerotic changes are present within the cavernous internal carotid arteries bilaterally. Calcifications are also present at the dural margin of the vertebral arteries. Norris hyperdense vessel is present. Skull: Calvarium is intact. Norris focal lytic or blastic lesions are present. Norris significant extracranial soft tissue lesion is present. Sinuses/Orbits: The paranasal sinuses and mastoid air cells are clear. The globes and orbits are  within normal limits. ASPECTS [facility]([place] Stroke Program Early CT Score) - Ganglionic level infarction (caudate, lentiform nuclei, internal capsule, insula, M1-M3 cortex): 7/7 - Supraganglionic infarction (M4-M6 cortex): 3/3 Total score (0-10 with 10 being normal): 10/10 IMPRESSION: 1. Subcortical white matter changes are more prominent left than right. This likely reflects the sequela of chronic microvascular ischemia. 2. Norris acute intracranial abnormality. 3. ASPECTS is 10/10. These results were called by telephone at the time of interpretation on 09/04/2020 at 2:07 pm to provider Maria. Iver NestleBhagat , who verbally acknowledged these results. Electronically Signed   By: Marin Robertshristopher  Mattern M.D.   On: 09/04/2020 14:08    Lab Data:  CBC: Recent Labs  Lab 09/04/20 1453 09/05/20 0721  WBC 7.8 16.0*  NEUTROABS 4.7  --   HGB 14.4 15.1*  HCT 40.5 42.4  MCV 91.6 93.2  PLT 286 271   Basic Metabolic Panel: Recent Labs  Lab 09/04/20 1405 09/04/20 1453 09/05/20 0721  NA  --  139 137  K  --  4.3 4.3  CL  --  102 99  CO2  --  29 27  GLUCOSE  --  193* 237*  BUN  --  27* 35*  CREATININE 0.80 0.69 0.63  CALCIUM  --  9.7 9.4   GFR: Estimated Creatinine Clearance: 70.1 mL/min (by C-G formula based on SCr of 0.63 mg/dL). Liver Function Tests: Recent Labs  Lab 09/04/20 1453  AST 23  ALT 27  ALKPHOS 38  BILITOT 0.7  PROT 7.5  ALBUMIN 3.9   Norris results for input(s): LIPASE, AMYLASE in the last 168 hours. Norris results for input(s): AMMONIA in the last 168 hours. Coagulation Profile: Recent Labs  Lab 09/04/20 1453  INR 1.0   Cardiac Enzymes: Norris results for input(s): CKTOTAL, CKMB, CKMBINDEX, TROPONINI in the last 168 hours. BNP (last 3 results) Norris results for input(s): PROBNP in the last 8760 hours. HbA1C: Norris results for input(s): HGBA1C in the last 72 hours. CBG: Recent Labs  Lab 09/04/20 1656 09/04/20 2323 09/05/20 0719  GLUCAP 121* 132* 229*   Lipid Profile: Recent Labs     09/05/20 0721  CHOL 255*  HDL 43  LDLCALC UNABLE TO CALCULATE IF TRIGLYCERIDE OVER 400 mg/dL  TRIG 604677*  CHOLHDL 5.9   Thyroid Function Tests: Norris results for input(s): TSH,  T4TOTAL, FREET4, T3FREE, THYROIDAB in the last 72 hours. Anemia Panel: Norris results for input(s): VITAMINB12, FOLATE, FERRITIN, TIBC, IRON, RETICCTPCT in the last 72 hours. Urine analysis:    Component Value Date/Time   COLORURINE YELLOW (A) 09/04/2020 1947   APPEARANCEUR HAZY (A) 09/04/2020 1947   LABSPEC 1.027 09/04/2020 1947   PHURINE 5.0 09/04/2020 1947   GLUCOSEU NEGATIVE 09/04/2020 1947   HGBUR NEGATIVE 09/04/2020 1947   BILIRUBINUR NEGATIVE 09/04/2020 1947   KETONESUR NEGATIVE 09/04/2020 1947   PROTEINUR 100 (A) 09/04/2020 1947   NITRITE NEGATIVE 09/04/2020 1947   LEUKOCYTESUR SMALL (A) 09/04/2020 1947     Lido Maske M.D. [facility] 09/05/2020, 10:36 AM  Available via Epic secure chat 7am-7pm After 7 pm, please refer to night coverage provider listed on amion.

## 2020-09-05 NOTE — Progress Notes (Signed)
Neurology Progress Note  Patient ID: 65 yo with hx prior strokes and multiple cerebrovascular risk factors presented 09/04/20 with acute confusional episode.  Interval hx:  This morning patient's mental status had improved back to baseline and she was preparing for hospital discharge. She then had a recurrent episode of staring off, jumbling her words, confused, and lethargic afterwards.   Details of patient's presenting hx were limited yesterday 2/2 her encephalopathy however additional history was revealed today by chart biopsy and directed questioning of patient and family.  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 She did not have any further events until 1 week after her 2021 hospitalization for stroke.   Per detailed hx documented by Dr. Satira Mccallum (her [facility] neurologist) Maria Norris was admitted to OSH from October 21-November 3, with acute CVA in the left MCA distribution (L parietal, temporal and frontal lobes). 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 She underwent CEA October 28th by Dr. Fabio Asa without complication and maintained on dual anti-platelet therapy with aspirin and Plavix upon discharge. Since her CVA she had resolution of her Language deficits and improved right hemiparesis. In November 2021 she had a first time seizure requiring intubation. On 11/11, she underwent MRI showing new left pre-frontal stroke and evolving previously seen areas. CTA head and neck November 12th demonstrated no stenosis in left common carotid or left internal carotid artery with postoperative changes including unchanged gas and fluid collection at lateral aspect carotid sheath.   Since that first event patient has been on keppra 500mg  bid. She told me initially she takes it as prescribed, then told me her PCP told her to d/c it last month, then told me she  didn't know. She was favored to have had another focal event in the ED and was lethargic and confused afterwards when I saw her (with no memory of the event).  Interval data:  MRI brain MRA H&N No acute or subacute brain infarction. 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  MR angiography does not large vessel anterior circulation occlusion. There are stenoses at the right MCA bifurcation, which could place the patient at risk of right MCA branch vessel infarction.  No antegrade flow in the left vertebral artery. Atherosclerotic narrowing of the PCA branches, left more than right  CNS imaging personally reviewed  Routine EEG 09/05/20 - personally reviewed and interpreted  This EEG was obtained while awake and asleep and is abnormal due to mild diffuse slowing with superimposed focal L temporo-parietal slowing, indicating global cerebral dysfunction as well as focal dysfunction in the site of prior ischemic damage.   There were occasional sharply contoured discharges in the left temporo-parietal region that did not disrupt the background, did not have a significant field, and were not definitively epileptiform. There were no electrographic seizures identified. If further characterization would be helpful clinically, consider prolonged EEG monitoring with video.   Exam: Vitals:   09/05/20 1500 09/05/20 1500  BP:    Pulse: 61   Resp: 12   Temp:  98.2 F (36.8 C)  SpO2: 93%    Gen: In bed, NAD Resp: non-labored breathing, no acute distress Abd: soft, nt   Mental Status: Patient is awake, alert, able to follow simple commands intermittently.  Able to name and repeat.  Knows own name and hospital only. Cranial Nerves: II: Visual Fields are full. Pupils are equal, round, and reactive to light.   III,IV, VI: EOMI without ptosis or diploplia.  No significant nystagmus V: Facial sensation is symmetric to eyelash brush VII: Facial  movement is symmetric  VIII: hearing is intact to voice X: Uvula elevates symmetrically XI: Unable to assess secondary to patient's mental status /participation XII: tongue is midline without atrophy or fasciculations.  Motor: Unable to perform formal strength testing 2/2 pt effort and confusion however she is able to move all extremities well at least anti-gravity  Sensory: SILT Deep Tendon Reflexes: 2+ and symmetric in the biceps and patellae.  Plantars: Toes are mute on the right and upgoing on left Cerebellar, gait: UTA 2/2 AMS   Impression: 65 yo with hx childhood seizures, resolved and single adult seizure Nov 2021 in setting of acute stroke p/w multiple acute confusional episodes c/f focal seizure with impaired awareness  Recommendations:  - S/p keppra load 20 mg/kg - Increase keppra to 750mg  q 12 hrs (IV until she can reliably take po). She should be discharged on this dose. - Patient has had multiple seizures at this point and has abnormal brain MRI and EEG findings. I strongly recommended that she continue taking antiseizure medication indefinitely (forever) bc she will be at high risk of recurrent event if she goes off her seizure medications. She agreed but was still encephalopathic and this conversation should be revisited tomorrow. - Monitor overnight to ensure return to her recent baseline and no further events - May f/u with Dr. June at [facility] who is her outpatient neurologist - No changes to current medication regimen for secondary stroke prevention (ASA 325mg  antiplatelet monotherapy per Dr. [facility] and Dr. her vascular surgeon + atorvastatin 40mg  daily) - Patient is unable to drive, operate heavy machinery, perform activities at heights or participate in water activities for at least 6 mos after last seizure and also until release by outpatient physician. This should be reiterated to patient at hospital discharge and also written in discharge instructions  We will see  patient in AM and review our recommendations with her when she is more lucid. As long as she is back to her recent baseline after that she would be ok to discharge home from a neuro standpoint.

## 2020-09-05 NOTE — ED Notes (Addendum)
Pt O2 saturation 89% on RA. Pt placed on 2L [place]. Current O2 saturation 97%. Dr. Isidoro Donning, MD made aware.

## 2020-09-05 NOTE — ED Notes (Signed)
MD notified of patients bp.

## 2020-09-05 NOTE — Progress Notes (Signed)
eeg done °

## 2020-09-05 NOTE — ED Notes (Signed)
Patient is reporting double/blurry vision, increased difficulty speaking. Dr. Clyde Lundborg notified. Awaiting further orders.

## 2020-09-05 NOTE — ED Notes (Signed)
Floor coverage MD messaged this RN requested pain medication for patient d/t headache and backache being uncontrolled by tylenol. New orders placed.

## 2020-09-05 NOTE — ED Notes (Signed)
Patient transported to EEG

## 2020-09-06 LAB — CBC
HCT: 40.5 % (ref 36.0–46.0)
Hemoglobin: 13.7 g/dL (ref 12.0–15.0)
MCH: 32.5 pg (ref 26.0–34.0)
MCHC: 33.8 g/dL (ref 30.0–36.0)
MCV: 96.2 fL (ref 80.0–100.0)
Platelets: 281 10*3/uL (ref 150–400)
RBC: 4.21 MIL/uL (ref 3.87–5.11)
RDW: 12.9 % (ref 11.5–15.5)
WBC: 10.9 10*3/uL — ABNORMAL HIGH (ref 4.0–10.5)
nRBC: 0 % (ref 0.0–0.2)

## 2020-09-06 LAB — GLUCOSE, CAPILLARY
Glucose-Capillary: 201 mg/dL — ABNORMAL HIGH (ref 70–99)
Glucose-Capillary: 198 mg/dL — ABNORMAL HIGH (ref 70–99)
Glucose-Capillary: 199 mg/dL — ABNORMAL HIGH (ref 70–99)
Glucose-Capillary: 197 mg/dL — ABNORMAL HIGH (ref 70–99)
Glucose-Capillary: 238 mg/dL — ABNORMAL HIGH (ref 70–99)

## 2020-09-06 LAB — BASIC METABOLIC PANEL
Anion gap: 11 (ref 5–15)
BUN: 36 mg/dL — ABNORMAL HIGH (ref 8–23)
CO2: 31 mmol/L (ref 22–32)
Calcium: 9.7 mg/dL (ref 8.9–10.3)
Chloride: 94 mmol/L — ABNORMAL LOW (ref 98–111)
Creatinine, Ser: 0.97 mg/dL (ref 0.44–1.00)
GFR, Estimated: >60 mL/min (ref >60)
Glucose, Bld: 162 mg/dL — ABNORMAL HIGH (ref 70–99)
Potassium: 4.6 mmol/L (ref 3.5–5.1)
Sodium: 136 mmol/L (ref 135–145)

## 2020-09-06 MED ORDER — INSULIN GLARGINE 100 UNIT/ML ~~LOC~~ SOLN
12.0000 [IU] | Freq: Every day | SUBCUTANEOUS | Status: DC
  Administered 2020-09-06 – 2020-09-07 (×2): 12 [IU] via SUBCUTANEOUS
  Filled 2020-09-06 (×3): qty 0.12

## 2020-09-06 MED ORDER — SODIUM CHLORIDE 0.9 % IV SOLN
INTRAVENOUS | Status: DC | PRN
  Administered 2020-09-06: 250 mL via INTRAVENOUS

## 2020-09-06 MED ORDER — GABAPENTIN 400 MG PO CAPS
800.0000 mg | ORAL_CAPSULE | Freq: Three times a day (TID) | ORAL | Status: DC
  Administered 2020-09-06 – 2020-09-08 (×5): 800 mg via ORAL
  Filled 2020-09-06 (×5): qty 2

## 2020-09-06 MED ORDER — GABAPENTIN 800 MG PO TABS
800.0000 mg | ORAL_TABLET | Freq: Three times a day (TID) | ORAL | Status: DC
  Filled 2020-09-06: qty 1

## 2020-09-06 NOTE — Progress Notes (Signed)
Neurology Progress Note  Patient ID: 65 yo with hx prior strokes and multiple cerebrovascular risk factors presented 09/04/20 with acute confusional episode.  Subjective: NAEON. Patient did receive a prn dilaudid dose yesterday evening and was highly altered afterwards. Her mental status has slowly improved over the course of the day and this evening family member at bedside states that she is nearing her recent baseline. Patient and family request I call Dr. Janann August at [facility] to update him and I assured them I will call him tomorrow.  Data:  MRI brain MRA H&N No acute or subacute brain infarction. 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  MR angiography does not large vessel anterior circulation occlusion. There are stenoses at the right MCA bifurcation, which could place the patient at risk of right MCA branch vessel infarction.  No antegrade flow in the left vertebral artery. Atherosclerotic narrowing of the PCA branches, left more than right  CNS imaging personally reviewed  Routine EEG 09/05/20 - personally reviewed and interpreted  This EEG was obtained while awake and asleep and is abnormal due to mild diffuse slowing with superimposed focal L temporo-parietal slowing, indicating global cerebral dysfunction as well as focal dysfunction in the site of prior ischemic damage.   There were occasional sharply contoured discharges in the left temporo-parietal region that did not disrupt the background, did not have a significant field, and were not definitively epileptiform. There were no electrographic seizures identified. If further characterization would be helpful clinically, consider prolonged EEG monitoring with video.   Exam: Vitals:   09/06/20 1254 09/06/20 1620  BP: (!) 159/54 (!) 155/62  Pulse: 68 69  Resp: 16 16  Temp: (!) 97.4 F (36.3 C) 98.9 F (37.2 C)  SpO2: 95% 94%   Gen: In bed, NAD Resp: non-labored breathing, no  acute distress Abd: soft, nt   Mental Status: Patient is awake, alert, able to follow simple commands.  Able to name and repeat.  Knows own name and hospital only. Cranial Nerves: II: Visual Fields are full. Pupils are equal, round, and reactive to light.   III,IV, VI: EOMI without ptosis or diploplia.  No significant nystagmus V: Facial sensation is symmetric to eyelash brush VII: Facial movement is symmetric  VIII: hearing is intact to voice X: Uvula elevates symmetrically XI: Unable to assess secondary to patient's mental status /participation XII: tongue is midline without atrophy or fasciculations.  Motor: Unable to perform formal strength testing 2/2 pt effort and confusion however she is able to move all extremities well at least anti-gravity  Sensory: SILT Deep Tendon Reflexes: 2+ and symmetric in the biceps and patellae.  Plantars: Toes are mute on the right and upgoing on left Cerebellar, gait: deferred   Impression: 65 yo with hx childhood seizures, resolved and single adult seizure Nov 2021 in setting of acute stroke p/w multiple acute confusional episodes c/f focal seizure with impaired awareness  Recommendations:  - Continue keppra 750mg  q 12 hrs (IV until she can reliably take po). She should be discharged on this dose. - Patient has had multiple seizures at this point and has abnormal brain MRI and EEG findings. I strongly recommended that she continue taking antiseizure medication indefinitely (forever) bc she will be at high risk of recurrent event if she goes off her seizure medications. She agreed but was still encephalopathic and this conversation should be revisited tomorrow. - Monitor overnight to ensure return to her recent baseline and  no further events. Suspect intermittent encephalopathy initially post-ictal and then medication effect. She has not had a new stroke or identifiable injury that should make her require SNF level care when she did not before. -  May f/u with Dr. Janann August at [facility] who is her outpatient neurologist - No changes to current medication regimen for secondary stroke prevention (ASA 325mg  antiplatelet monotherapy per Dr. and Dr. Janann August her vascular surgeon + atorvastatin 40mg  daily) - Patient is unable to drive, operate heavy machinery, perform activities at heights or participate in water activities for at least 6 mos after last seizure and also until release by outpatient physician. This should be reiterated to patient at hospital discharge and also written in discharge instructions  Will re-examine tomorrow.  Mosetta Putt, MD [facility] [phone]  If 7pm- 7am, please page neurology on call as listed in [facility].

## 2020-09-06 NOTE — Progress Notes (Signed)
Mobility Specialist - Progress Note   09/06/20 1500  Mobility  Activity Refused mobility  Mobility performed by Mobility specialist   Pt sleeping on arrival, family at bedside. Pt appears lethargic. Pt answers to name, but does not open eyes and stops responding after that. Will attempt session at another date/time as pt is more alert/awake.    Filiberto Pinks Mobility Specialist 09/06/20, 3:35 PM

## 2020-09-06 NOTE — Progress Notes (Addendum)
[facility]                                                                              Patient Demographics  Maria Norris, is a 65 y.o. female, DOB - 1955-08-05, ZOX:[id number]  Admit date - 09/04/2020   Admitting Physician Lorretta HarpXilin Niu, MD  Outpatient Primary MD for the patient is Pcp, No  Outpatient specialists:   LOS - 1  days   Medical records reviewed and are as summarized below:    Chief Complaint  Patient presents with  . 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 Patient sister at the bedside reported that they were out to eat at [employer] restaurant for Mother's Day when patient suddenly became confused not knowing where she is and started crying with decreased responsiveness.  Patient was shaking bilateral hands but did not seem to have seizure.  Also had right-sided headache and leaning towards right side when walking.  EMS was called. Per sister, BP has been poorly controlled and frequently up to 190s to 200s.  Patient's mental status improved in ED, back to baseline.  No focal weakness, slurred speech or facial droop.  Mild shortness of breath. CT head was negative for acute intracranial abnormalities, MRI, MRA negative for acute stroke. Neurology was consulted. BP 230/71 in ED, heart rate 60s Patient was admitted for acute/transient metabolic encephalopathy with hypertensive urgency  09/04/20- 09/06/20 Patient was admitted for acute encephalopathy, seizures versus hypertensive emergency -MRI was negative for stroke, MRI negative for LVOT.  Neurology was consulted.  EEG showed mild diffuse slowing with  superimposed low focal left temporal parietal slowing. Received loading dose of Keppra and was increased to 750 mg twice daily PT evaluation recommended SNF, however patient's family interested in home health PT  Assessment & Plan    Principal Problem:   Acute metabolic encephalopathy - possibly due to acute hypertensive urgency versus seizures vs meds.  BP in 200s in ED  -MRI negative for acute stroke, MRA negative for LVOT -EEG showed mild diffuse slowing with superimposed local focal left temporal parietal slowing, indicating global cerebral dysfunction as well as focal dysfunction in the site of prior ischemic damage -Repeat CT head was done due to neurological changes, garbled speech and confusion and showed no evidence of acute intracranial abnormality. -UA negative for UTI.  Neurology following, received another loading dose of Keppra and increased to 750 mg twice daily -PT evaluation recommended SNF however family is interested in home health PT (lives with sister and another sister lives close by) - DC dilaudid, toradol, decrease neurontin  To 800mg  TID (was taking 1200mg  q6hrs)   Active Problems: Hypertensive urgency -With a history of uncontrolled hypertension.  -BP improving, continue BiDil, clonidine, Cozaar, metoprolol, nifedipine   Mild acute respiratory failure with hypoxia -Improving, O2 sats 95%  on 2 L  -Chest x-ray showed mild bibasilar atelectasis or scarring, no dense airspace consolidation or pleural fluid    Coronary artery disease -Currently no chest pain or acute shortness of breath  -Lipid panel showed triglycerides of 677, cholesterol 255, unable to calculate LDL -Continue Lipitor 40 mg daily, add Zetia -Troponins negative -2D echo showed EF of 55 to 60%, grade 1 diastolic dysfunction    Diabetes mellitus without complication (HCC), NIDDM, underlying history of CAD, uncontrolled with hyperglycemia - hold metformin -Hemoglobin A1c 7.7 Recent Labs     09/05/20 0719 09/05/20 1120 09/05/20 1751 09/05/20 1935 09/06/20 0843 09/06/20 1251  GLUCAP 229* 272* 204* 118* 201* 198*   -Increase Lantus to home dose 20 units daily at bedtime, continue sliding scale insulin  Obesity Estimated body mass index is 28.55 kg/m as calculated from the following:   Height as of this encounter:  (1.575 m).   Weight as of this encounter: 70.8 kg.  Code Status: Full CODE STATUS DVT Prophylaxis:  enoxaparin (LOVENOX) injection 40 mg Start: 09/05/20 0800   Level of Care: Level of care: Progressive Cardiac Family Communication: Discussed all imaging results, lab results, explained to patient's sister, Andrey Campanile at bed side   Disposition Plan:     Status is: Inpatient   Inpatient level of care appropriate due to severity of illness  Dispo: The patient is from: Home              Anticipated d/c is to: Home              Patient currently is not medically stable to d/c.  Still very weak somewhat confused, not safe to be discharged home.  Patient's sister requested home health PT, lives with sister and another sister lives close by.  Per sister, patient has done with home health PT in the past.   Difficult to place patient No      Time Spent in minutes 35 minutes  Procedures:  MRI, MRI brain  Consultants:   Neurology  Antimicrobials:   Anti-infectives (From admission, onward)   None         Medications  Scheduled Meds: . aspirin  325 mg Oral Daily  . atorvastatin  40 mg Oral Daily  . cloNIDine  0.1 mg Oral BID  . enoxaparin (LOVENOX) injection  40 mg Subcutaneous Q24H  . ezetimibe  10 mg Oral Daily  . gabapentin  1,200 mg Oral Q6H  . insulin aspart  0-5 Units Subcutaneous QHS  . insulin aspart  0-9 Units Subcutaneous TID WC  . insulin glargine  9 Units Subcutaneous QHS  . isosorbide mononitrate  30 mg Oral Daily  . isosorbide-hydrALAZINE  1 tablet Oral TID  . losartan  100 mg Oral Daily  . magnesium oxide  400 mg Oral Daily   . metoprolol tartrate  25 mg Oral BID  . multivitamin with minerals  1 tablet Oral Daily  . NIFEdipine  60 mg Oral Daily  . pantoprazole  40 mg Oral Daily  . vitamin B-12  100 mcg Oral Daily   Continuous Infusions: . levETIRAcetam Stopped (09/06/20 1029)  . promethazine (PHENERGAN) injection (IM or IVPB) Stopped (09/05/20 0908)  . ringers Stopped (09/06/20 0954)   PRN Meds:.acetaminophen, acetaminophen, albuterol, cloNIDine, hydrALAZINE, HYDROmorphone (DILAUDID) injection, ketorolac, labetalol, LORazepam, nitroGLYCERIN, ondansetron (ZOFRAN) IV, promethazine (PHENERGAN) injection (IM or IVPB)      Subjective:   Aeryn Medici was seen and examined today.  BP improving, no chest pain, no acute  shortness of breath.  Still appears to be somewhat confused, not at baseline per sister at bedside  Objective:   Vitals:   09/05/20 2042 09/06/20 0438 09/06/20 0841 09/06/20 1254  BP: (!) 187/58 (!) 145/79 (!) 186/71 (!) 159/54  Pulse: 73 73 71 68  Resp:  Temp:  98.6 F (37 C) 98.6 F (37 C) (!) 97.4 F (36.3 C)  TempSrc:  Oral Oral   SpO2:  93% 96% 95%  Weight:      Height:        Intake/Output Summary (Last 24 hours) at 09/06/2020 1338 Last data filed at 09/06/2020 1203 Gross per 24 hour  Intake 1151.69 ml  Output 150 ml  Net 1001.69 ml     Wt Readings from Last 3 Encounters:  09/05/20 70.8 kg    Physical Exam  General: Alert and oriented x 3, not quite at her baseline per sister at bedside  Cardiovascular: S1 S2 clear, RRR  Respiratory: CTAB, no wheezing, rales or rhonchi  Gastrointestinal: Soft, nontender, nondistended, NBS  Ext: no pedal edema bilaterally  Neuro: moving all 4 extremities  Musculoskeletal: No cyanosis, clubbing  Skin: No rashes  Psych: still appears to be somewhat confused   Data Reviewed:  I have personally reviewed following labs and imaging studies  Micro Results Recent Results (from the past 240 hour(s))  Resp Panel by  RT-PCR (Flu A&B, Covid) Nasopharyngeal Swab     Status: None   Collection Time: 09/04/20  2:53 PM   Specimen: Nasopharyngeal Swab; Nasopharyngeal(NP) swabs in vial transport medium  Result Value Ref Range Status   SARS Coronavirus 2 by RT PCR NEGATIVE NEGATIVE Final    Comment: (NOTE) SARS-CoV-2 target nucleic acids are NOT DETECTED.  The SARS-CoV-2 RNA is generally detectable in upper respiratory specimens during the acute phase of infection. The lowest concentration of SARS-CoV-2 viral copies this assay can detect is 138 copies/mL. A negative result does not preclude SARS-Cov-2 infection and should not be used as the sole basis for treatment or other patient management decisions. A negative result may occur with  improper specimen collection/handling, submission of specimen other than nasopharyngeal swab, presence of viral mutation(s) within the areas targeted by this assay, and inadequate number of viral copies(<138 copies/mL). A negative result must be combined with clinical observations, patient history, and epidemiological information. The expected result is Negative.  Fact Sheet for Patients:  BloggerCourse.com  Fact Sheet for Healthcare Providers:  SeriousBroker.it  This test is no t yet approved or cleared by the [place] FDA and  has been authorized for detection and/or diagnosis of SARS-CoV-2 by FDA under an Emergency Use Authorization (EUA). This EUA will remain  in effect (meaning this test can be used) for the duration of the COVID-19 declaration under Section 564(b)(1) of the Act, 21 U.S.C.section 360bbb-3(b)(1), unless the authorization is terminated  or revoked sooner.       Influenza A by PCR NEGATIVE NEGATIVE Final   Influenza B by PCR NEGATIVE NEGATIVE Final    Comment: (NOTE) The Xpert Xpress SARS-CoV-2/FLU/RSV plus assay is intended as an aid in the diagnosis of influenza from Nasopharyngeal swab  specimens and should not be used as a sole basis for treatment. Nasal washings and aspirates are unacceptable for Xpert Xpress SARS-CoV-2/FLU/RSV testing.  Fact Sheet for Patients: BloggerCourse.com  Fact Sheet for Healthcare Providers: SeriousBroker.it  This test is not yet approved or cleared by the [place] FDA and has been authorized for detection and/or  diagnosis of SARS-CoV-2 by FDA under an Emergency Use Authorization (EUA). This EUA will remain in effect (meaning this test can be used) for the duration of the COVID-19 declaration under Section 564(b)(1) of the Act, 21 U.S.C. section 360bbb-3(b)(1), unless the authorization is terminated or revoked.  Performed at [facility], [address] [place], [place] [address]     Radiology Reports DG Chest 2 View  Result Date: 09/05/2020 CLINICAL DATA:  Shortness of breath. Sudden onset headache, weakness and confusion. EXAM: CHEST - 2 VIEW COMPARISON:  None. FINDINGS: Trachea is midline. Heart is at the upper limits of normal in size to mildly enlarged. Thoracic aorta is calcified. Mild basilar streaky opacification, left greater than right. No dense airspace consolidation or pleural fluid. Degenerative changes in the spine. IMPRESSION: 1. Mild bibasilar atelectasis or scarring. 2.  Aortic atherosclerosis (ICD10-I70.0). Electronically Signed   By: Leanna Battles M.D.   On: 09/05/2020 11:35   CT HEAD WO CONTRAST  Result Date: 09/05/2020 CLINICAL DATA:  Transient ischemic attack (TIA); sing double, slurred speech. Dizziness and double vision with headache. EXAM: CT HEAD WITHOUT CONTRAST TECHNIQUE: Contiguous axial images were obtained from the base of the skull through the vertex without intravenous contrast. COMPARISON:  Contrast head CT 09/04/2020. MRI/MRA head 09/04/2020. FINDINGS: Brain: Cerebral volume is normal for age. Redemonstrated patchy small chronic white matter  infarcts within the left centrum semiovale and left greater than right corona radiata. Redemonstrated chronic lacunar infarct within the left pons. There is no acute intracranial hemorrhage. No demarcated cortical infarct. No extra-axial fluid collection. No evidence of intracranial mass. No midline shift. Unchanged asymmetric CSF density prominence posterior to the right cerebellum, measuring 2.3 x 2.6 cm in transaxial dimensions and possibly reflecting an incidental arachnoid cyst. Vascular: No hyperdense vessel.  Atherosclerotic calcifications. Skull: Normal. Negative for fracture or focal lesion. Sinuses/Orbits: Visualized orbits show no acute finding. Trace bilateral ethmoid and right sphenoid sinus mucosal thickening at the imaged levels. Other: Small bilateral mastoid effusions. IMPRESSION: No evidence of acute intracranial abnormality. Redemonstrated patchy small chronic white matter infarcts within the left centrum semiovale and left greater than right corona radiata. Redemonstrated chronic lacunar infarct within the left pons. Small bilateral mastoid effusions. Electronically Signed   By: Jackey Loge DO   On: 09/05/2020 15:09   MR ANGIO HEAD WO CONTRAST  Result Date: 09/04/2020 CLINICAL DATA:  Sudden onset of confusion and occipital headache. EXAM: MRI HEAD WITHOUT CONTRAST MRA HEAD WITHOUT CONTRAST TECHNIQUE: Multiplanar, multiecho pulse sequences of the brain and surrounding structures were obtained without intravenous contrast. Angiographic images of the head were obtained using MRA technique without contrast. COMPARISON:  Head CT earlier same day FINDINGS: MRI HEAD FINDINGS Brain: Diffusion imaging does not show any acute or subacute infarction. There is old infarction affecting the left side of the upper pons. No focal cerebellar finding. Cerebral hemispheres show old infarction in the deep white matter of the left hemisphere with some gliosis and volume loss. Few old small vessel infarctions in  the right hemispheric white matter. No cortical or large vessel territory infarction. No mass lesion, hemorrhage, hydrocephalus or extra-axial collection. Minimal hemosiderin deposition in old left deep white matter infarction posteriorly. Vascular: Major vessels at the base of the brain show flow. Skull and upper cervical spine: Negative Sinuses/Orbits: Clear/normal Other: Bilateral mastoid effusions. MRA HEAD FINDINGS Both internal carotid arteries are widely patent through the skull base and siphon regions. The anterior and middle cerebral vessels are patent. There are stenoses  at the right MCA bifurcation and proximal M2 segments which could place the patient at risk of MCA territory infarction on the right. Right vertebral artery is widely patent to the basilar. No antegrade flow in the left vertebral artery. No basilar stenosis. Flow is present within both superior cerebellar arteries and both posterior cerebral arteries. Narrowing and irregularity of the PCA branches, left worse than right. IMPRESSION: No acute or subacute brain infarction. 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 MR angiography does not large vessel anterior circulation occlusion. There are stenoses at the right MCA bifurcation, which could place the patient at risk of right MCA branch vessel infarction. No antegrade flow in the left vertebral artery. Atherosclerotic narrowing of the PCA branches, left more than right Electronically Signed   By: Paulina Fusi M.D.   On: 09/04/2020 14:40   MR BRAIN WO CONTRAST  Result Date: 09/04/2020 CLINICAL DATA:  Sudden onset of confusion and occipital headache. EXAM: MRI HEAD WITHOUT CONTRAST MRA HEAD WITHOUT CONTRAST TECHNIQUE: Multiplanar, multiecho pulse sequences of the brain and surrounding structures were obtained without intravenous contrast. Angiographic images of the head were obtained using MRA technique without contrast. COMPARISON:  Head CT  earlier same day FINDINGS: MRI HEAD FINDINGS Brain: Diffusion imaging does not show any acute or subacute infarction. There is old infarction affecting the left side of the upper pons. No focal cerebellar finding. Cerebral hemispheres show old infarction in the deep white matter of the left hemisphere with some gliosis and volume loss. Few old small vessel infarctions in the right hemispheric white matter. No cortical or large vessel territory infarction. No mass lesion, hemorrhage, hydrocephalus or extra-axial collection. Minimal hemosiderin deposition in old left deep white matter infarction posteriorly. Vascular: Major vessels at the base of the brain show flow. Skull and upper cervical spine: Negative Sinuses/Orbits: Clear/normal Other: Bilateral mastoid effusions. MRA HEAD FINDINGS Both internal carotid arteries are widely patent through the skull base and siphon regions. The anterior and middle cerebral vessels are patent. There are stenoses at the right MCA bifurcation and proximal M2 segments which could place the patient at risk of MCA territory infarction on the right. Right vertebral artery is widely patent to the basilar. No antegrade flow in the left vertebral artery. No basilar stenosis. Flow is present within both superior cerebellar arteries and both posterior cerebral arteries. Narrowing and irregularity of the PCA branches, left worse than right. IMPRESSION: No acute or subacute brain infarction. 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 MR angiography does not large vessel anterior circulation occlusion. There are stenoses at the right MCA bifurcation, which could place the patient at risk of right MCA branch vessel infarction. No antegrade flow in the left vertebral artery. Atherosclerotic narrowing of the PCA branches, left more than right Electronically Signed   By: Paulina Fusi M.D.   On: 09/04/2020 14:40   EEG adult  Result Date: 09/05/2020 Jefferson Fuel, MD     09/05/2020  5:48 PM Routine EEG Report Tamiya Colello is a 65 y.o. female with a history of remote multifocal ischemic infarcts and seizures who is undergoing an EEG to evaluate for seizures. Report: This EEG was acquired with electrodes placed according to the International 10-20 electrode system (including Fp1, Fp2, F3, F4, C3, C4, P3, P4, O1, O2, T3, T4, T5, T6, A1, A2, Fz, Cz, Pz). The following electrodes were missing or displaced:  none. The occipital dominant rhythm was 6-7 Hz with overriding faster frequencies in the beta range throughout the recording. This activity is reactive to stimulation. Drowsiness was manifested by background fragmentation; deeper stages of sleep were characterized by sleep spindles and K complexes. There was occasional slowing over the L temporo-parietal region. There were occasional sharply contoured discharges in the left temporo-parietal region that did not disrupt the background, did not have a significant field, and were not definitively epileptiform. There were no electrographic seizures identified. There was no abnormal response to photic stimulation or hyperventilation. Impression & clinical correlation: This EEG was obtained while awake and asleep and is abnormal due to mild diffuse slowing with superimposed focal L temporo-parietal slowing, indicating global cerebral dysfunction as well as focal dysfunction in the site of prior ischemic damage. There were occasional sharply contoured discharges in the left temporo-parietal region that did not disrupt the background, did not have a significant field, and were not definitively epileptiform. There were no electrographic seizures identified. If further characterization would be helpful clinically, consider prolonged EEG monitoring with video.   Bing Neighbors, MD [facility] [phone] If 7pm- 7am, please page neurology on call as listed in AMION.   ECHOCARDIOGRAM COMPLETE  Result Date: 09/05/2020     ECHOCARDIOGRAM REPORT   Patient Name:   KEYARA ENT Date of Exam: 09/05/2020 Medical Rec #:  [id number]    Height:       62.0 in Accession #:    [id number]   Weight:       174.5 lb Date of Birth:  01-14-1956   BSA:          1.804 m Patient Age:    64 years     BP:           142/62 mmHg Patient Gender: F            HR:           67 bpm. Exam Location:  [facility] Procedure: 2D Echo, Color Doppler and Cardiac Doppler Indications:     R01.1 Murmur  History:         Patient has no prior history of Echocardiogram examinations.                  CAD; Risk Factors:Hypertension, Diabetes and Dyslipidemia.  Sonographer:     Humphrey Rolls RDCS (AE) Referring Phys:  [id number] Arvie Villarruel K Tamana Hatfield Diagnosing Phys: Julien Nordmann MD  Sonographer Comments: Technically difficult study due to poor echo windows, suboptimal parasternal window and no subcostal window. IMPRESSIONS  1. Left ventricular ejection fraction, by estimation, is 55 to 60%. The left ventricle has normal function. The left ventricle has no regional wall motion abnormalities. There is moderate left ventricular hypertrophy. Left ventricular diastolic parameters are consistent with Grade I diastolic dysfunction (impaired relaxation).  2. Right ventricular systolic function is normal. The right ventricular size is normal.  3. Left atrial size was mildly dilated.  4. The aortic valve was not well visualized. Aortic valve regurgitation is not visualized. Mild aortic valve stenosis. Aortic valve area, by VTI measures 1.52 cm. Aortic valve mean gradient measures 10.0 mmHg. FINDINGS  Left Ventricle: Left ventricular ejection fraction, by estimation, is 55 to 60%. The left ventricle has normal function. The left ventricle has no regional wall motion abnormalities. The left ventricular internal cavity size was normal in size. There is  moderate left ventricular hypertrophy. Left ventricular diastolic parameters are consistent with Grade I diastolic dysfunction (impaired relaxation). Right  Ventricle:  The right ventricular size is normal. No increase in right ventricular wall thickness. Right ventricular systolic function is normal. Left Atrium: Left atrial size was mildly dilated. Right Atrium: Right atrial size was normal in size. Pericardium: There is no evidence of pericardial effusion. Mitral Valve: The mitral valve is normal in structure. No evidence of mitral valve regurgitation. No evidence of mitral valve stenosis. MV peak gradient, 5.4 mmHg. The mean mitral valve gradient is 2.0 mmHg. Tricuspid Valve: The tricuspid valve is normal in structure. Tricuspid valve regurgitation is not demonstrated. No evidence of tricuspid stenosis. Aortic Valve: The aortic valve was not well visualized. Aortic valve regurgitation is not visualized. Mild aortic stenosis is present. Aortic valve mean gradient measures 10.0 mmHg. Aortic valve peak gradient measures 19.4 mmHg. Aortic valve area, by VTI  measures 1.52 cm. Pulmonic Valve: The pulmonic valve was normal in structure. Pulmonic valve regurgitation is not visualized. No evidence of pulmonic stenosis. Aorta: The aortic root is normal in size and structure. Venous: The inferior vena cava is normal in size with greater than 50% respiratory variability, suggesting right atrial pressure of 3 mmHg. IAS/Shunts: No atrial level shunt detected by color flow Doppler.  LEFT VENTRICLE PLAX 2D LVIDd:         4.40 cm      Diastology LVIDs:         3.40 cm      LV e' medial:    4.57 cm/s LV PW:         1.40 cm      LV E/e' medial:  15.5 LV IVS:        1.30 cm      LV e' lateral:   3.81 cm/s LVOT diam:     1.70 cm      LV E/e' lateral: 18.6 LV SV:         58 LV SV Index:   32 LVOT Area:     2.27 cm  LV Volumes (MOD) LV vol d, MOD A2C: 86.7 ml LV vol d, MOD A4C: 124.0 ml LV vol s, MOD A2C: 41.5 ml LV vol s, MOD A4C: 51.8 ml LV SV MOD A2C:     45.2 ml LV SV MOD A4C:     124.0 ml LV SV MOD BP:      58.4 ml RIGHT VENTRICLE RV Basal diam:  3.30 cm LEFT ATRIUM              Index       RIGHT ATRIUM           Index LA diam:        4.20 cm 2.33 cm/m  RA Area:     11.70 cm LA Vol (A2C):   48.1 ml 26.66 ml/m RA Volume:   24.70 ml  13.69 ml/m LA Vol (A4C):   59.7 ml 33.09 ml/m LA Biplane Vol: 55.5 ml 30.76 ml/m  AORTIC VALVE                    PULMONIC VALVE AV Area (Vmax):    1.63 cm     PV Vmax:       1.15 m/s AV Area (Vmean):   1.59 cm     PV Vmean:      76.800 cm/s AV Area (VTI):     1.52 cm     PV VTI:        0.198 m AV Vmax:  220.00 cm/s  PV Peak grad:  5.3 mmHg AV Vmean:          147.000 cm/s PV Mean grad:  3.0 mmHg AV VTI:            0.380 m AV Peak Grad:      19.4 mmHg AV Mean Grad:      10.0 mmHg LVOT Vmax:         158.00 cm/s LVOT Vmean:        103.000 cm/s LVOT VTI:          0.254 m LVOT/AV VTI ratio: 0.67  AORTA Ao Root diam: 2.70 cm MITRAL VALVE MV Area (PHT): 2.42 cm     SHUNTS MV Area VTI:   1.71 cm     Systemic VTI:  0.25 m MV Peak grad:  5.4 mmHg     Systemic Diam: 1.70 cm MV Mean grad:  2.0 mmHg MV Vmax:       1.16 m/s MV Vmean:      60.5 cm/s MV Decel Time: 313 msec MV E velocity: 70.70 cm/s MV A velocity: 108.00 cm/s MV E/A ratio:  0.65 Julien Nordmann MD Electronically signed by Julien Nordmann MD Signature Date/Time: 09/05/2020/3:36:44 PM    Final    CT HEAD CODE STROKE WO CONTRAST`  Result Date: 09/04/2020 CLINICAL DATA:  Code stroke. Neuro deficit, acute, stroke suspected. Sudden onset of headache. Personal history of stroke and TIA. EXAM: CT HEAD WITHOUT CONTRAST TECHNIQUE: Contiguous axial images were obtained from the base of the skull through the vertex without intravenous contrast. COMPARISON:  None. FINDINGS: Brain: Subcortical white matter changes are more prominent left than right. No acute infarct, hemorrhage, or mass lesion is present. Basal ganglia are intact. Insular ribbon is normal. No acute or focal cortical abnormality is present. Posterior fossa arachnoid cyst noted. The brainstem and cerebellum are otherwise normal. The  ventricles are of normal size. No significant extraaxial fluid collection is present. Vascular: Atherosclerotic changes are present within the cavernous internal carotid arteries bilaterally. Calcifications are also present at the dural margin of the vertebral arteries. No hyperdense vessel is present. Skull: Calvarium is intact. No focal lytic or blastic lesions are present. No significant extracranial soft tissue lesion is present. Sinuses/Orbits: The paranasal sinuses and mastoid air cells are clear. The globes and orbits are within normal limits. ASPECTS [facility] Stroke Program Early CT Score) - Ganglionic level infarction (caudate, lentiform nuclei, internal capsule, insula, M1-M3 cortex): 7/7 - Supraganglionic infarction (M4-M6 cortex): 3/3 Total score (0-10 with 10 being normal): 10/10 IMPRESSION: 1. Subcortical white matter changes are more prominent left than right. This likely reflects the sequela of chronic microvascular ischemia. 2. No acute intracranial abnormality. 3. ASPECTS is 10/10. These results were called by telephone at the time of interpretation on 09/04/2020 at 2:07 pm to provider Dr. Iver Nestle , who verbally acknowledged these results. Electronically Signed   By: Marin Roberts M.D.   On: 09/04/2020 14:08    Lab Data:  CBC: Recent Labs  Lab 09/04/20 1453 09/05/20 0721 09/06/20 0500  WBC 7.8 16.0* 10.9*  NEUTROABS 4.7  --   --   HGB 14.4 15.1* 13.7  HCT 40.5 42.4 40.5  MCV 91.6 93.2 96.2  PLT 286 271 281   Basic Metabolic Panel: Recent Labs  Lab 09/04/20 1405 09/04/20 1453 09/05/20 0721 09/06/20 0500  NA  --  139 137 136  K  --  4.3 4.3 4.6  CL  --  102 99 94*  CO2  --  GLUCOSE  --  193* 237* 162*  BUN  --  27* 35* 36*  CREATININE 0.80 0.69 0.63 0.97  CALCIUM  --  9.7 9.4 9.7   GFR: Estimated Creatinine Clearance: 54.7 mL/min (by C-G formula based on SCr of 0.97 mg/dL). Liver Function Tests: Recent Labs  Lab 09/04/20 1453  AST 23  ALT 27   ALKPHOS 38  BILITOT 0.7  PROT 7.5  ALBUMIN 3.9   No results for input(s): LIPASE, AMYLASE in the last 168 hours. Recent Labs  Lab 09/05/20 1806  AMMONIA <9*   Coagulation Profile: Recent Labs  Lab 09/04/20 1453  INR 1.0   Cardiac Enzymes: No results for input(s): CKTOTAL, CKMB, CKMBINDEX, TROPONINI in the last 168 hours. BNP (last 3 results) No results for input(s): PROBNP in the last 8760 hours. HbA1C: Recent Labs    09/05/20 0721  HGBA1C 7.7*   CBG: Recent Labs  Lab 09/05/20 1120 09/05/20 1751 09/05/20 1935 09/06/20 0843 09/06/20 1251  GLUCAP 272* 204* 118* 201* 198*   Lipid Profile: Recent Labs    09/05/20 0721  CHOL 255*  HDL 43  LDLCALC UNABLE TO CALCULATE IF TRIGLYCERIDE OVER 400 mg/dL  TRIG 161*  CHOLHDL 5.9  LDLDIRECT 122.6*   Thyroid Function Tests: No results for input(s): TSH, T4TOTAL, FREET4, T3FREE, THYROIDAB in the last 72 hours. Anemia Panel: Recent Labs    09/05/20 1806  VITAMINB12 1,370*  FOLATE 43.0   Urine analysis:    Component Value Date/Time   COLORURINE YELLOW (A) 09/04/2020 1947   APPEARANCEUR HAZY (A) 09/04/2020 1947   LABSPEC 1.027 09/04/2020 1947   PHURINE 5.0 09/04/2020 1947   GLUCOSEU NEGATIVE 09/04/2020 1947   HGBUR NEGATIVE 09/04/2020 1947   BILIRUBINUR NEGATIVE 09/04/2020 1947   KETONESUR NEGATIVE 09/04/2020 1947   PROTEINUR 100 (A) 09/04/2020 1947   NITRITE NEGATIVE 09/04/2020 1947   LEUKOCYTESUR SMALL (A) 09/04/2020 1947     Dakoda Laventure M.D. [facility] 09/06/2020, 1:38 PM  Available via Epic secure chat 7am-7pm After 7 pm, please refer to night coverage provider listed on amion.

## 2020-09-06 NOTE — Evaluation (Signed)
Physical Therapy Evaluation Patient Details Name: Maria Norris MRN: [id number] DOB: Jul 26, 1955 Today's Date: 09/06/2020   History of Present Illness  65 y.o. 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  Clinical Impression  Pt showed good effort with PT exam but did not do particularly well with any dynamic/functional mobility and had a near fall/buckling LOBs with attempt at walking requiring direct assist to keep off the floor and back to bed.  She reports that she is normally able to manage in the home w/o AD and does not need O2.  Today her O2 remained in the mid/high 90s on O2, but (on multiple trials) w/o O2 it quickly dropped into the mid 80s each time with little to no activity.  Pt struggled to give a lot of background but acknowledges that she does not normally have slurred speech and tremors and is not close to her baseline.      Follow Up Recommendations SNF;Supervision for mobility/OOB    Equipment Recommendations  Rolling walker with 5" wheels (TBD at next venue of care)    Recommendations for Other Services       Precautions / Restrictions Precautions Precautions: Fall Restrictions Weight Bearing Restrictions: No      Mobility  Bed Mobility Overal bed mobility: Needs Assistance Bed Mobility: Supine to Sit;Sit to Supine     Supine to sit: Min assist;Mod assist Sit to supine: Mod assist   General bed mobility comments: Pt showed some effort in getting to EOB but has some tremoring and initial inability to maintain her balance.  Once situated sqare are EOB and able to calm she was able to maintain balance with b/l UEs at EOB.    Transfers Overall  transfer level: Needs assistance Equipment used: Rolling walker (2 wheeled) Transfers: Sit to/from Stand Sit to Stand: Min assist         General transfer comment: Pt needed light assist to get to standing, heavy reliance on the walker.  Ambulation/Gait Ambulation/Gait assistance: Max assist Gait Distance (Feet): 2 Feet Assistive device: Rolling walker (2 wheeled)       General Gait Details: Pt was able to take one step before she started having some tremoring b/l knees completely buckled, and she needed heavy direct assist to keep from crumbling to the floor.  Pt kept her upright and got her back to the bed.  Stairs            Wheelchair Mobility    Modified Rankin (Stroke Patients Only)       Balance Overall balance assessment: Needs assistance Sitting-balance support: Bilateral upper extremity supported Sitting balance-Leahy Scale: Fair Sitting balance - Comments: initial unsteadiness/tremoring, able to maintain sitting w/ UEs   Standing balance support: Bilateral upper extremity supported Standing balance-Leahy Scale: Zero Standing balance comment: Pt did poorly with standing tolerance, had b/l knee buckling with any attempts at weight shifts, etc                             Pertinent Vitals/Pain Pain Assessment: No/denies pain    Home Living Family/patient expects to be discharged to:: Private residence Living Arrangements: Other relatives Available Help at Discharge: Available PRN/intermittently (pt normally home alone t/o the day)  Home Access: Stairs to enter Entrance Stairs-Rails: Doctor, general practice of Steps: 4   Home Equipment: None      Prior Function Level of Independence: Independent         Comments: Per pt report: she does not get out of the home much but can get dressed, use bathroom, get around the home w/o assist     Hand Dominance        Extremity/Trunk Assessment   Upper Extremity  Assessment Upper Extremity Assessment: Generalized weakness (Pt with tremoring during)    Lower Extremity Assessment Lower Extremity Assessment: Generalized weakness       Communication   Communication: Expressive difficulties (Pt with slurred speech and inconsistent ability to answer on topic or at all internittently)  Cognition Arousal/Alertness: Awake/alert Behavior During Therapy: WFL for tasks assessed/performed Overall Cognitive Status: Impaired/Different from baseline                                 General Comments: unsure of actual baseline, but limited ability answer appropriately and on task      General Comments      Exercises General Exercises - Lower Extremity Ankle Circles/Pumps: AROM;10 reps Heel Slides: AAROM;5 reps Hip ABduction/ADduction: AROM;5 reps   Assessment/Plan    PT Assessment Patient needs continued PT services  PT Problem List Decreased strength;Decreased range of motion;Decreased balance;Decreased activity tolerance;Decreased mobility;Decreased safety awareness;Decreased knowledge of use of DME;Cardiopulmonary status limiting activity       PT Treatment Interventions DME instruction;Gait training;Functional mobility training;Therapeutic activities;Balance training;Therapeutic exercise;Neuromuscular re-education;Patient/family education    PT Goals (Current goals can be found in the Care Plan section)  Acute Rehab PT Goals Patient Stated Goal: get back home PT Goal Formulation: With patient Time For Goal Achievement: 09/20/20 Potential to Achieve Goals: Fair    Frequency Min 2X/week   Barriers to discharge        Co-evaluation               AM-PAC PT "6 Clicks" Mobility  Outcome Measure Help needed turning from your back to your side while in a flat bed without using bedrails?: A Little Help needed moving from lying on your back to sitting on the side of a flat bed without using bedrails?: A Lot Help needed  moving to and from a bed to a chair (including a wheelchair)?: A Lot Help needed standing up from a chair using your arms (e.g., wheelchair or bedside chair)?: A Lot Help needed to walk in hospital room?: Total Help needed climbing 3-5 steps with a railing? : Total 6 Click Score: 11    End of Session Equipment Utilized During Treatment: Gait belt Activity Tolerance: Patient tolerated treatment well Patient left: with bed alarm set;with call bell/phone within reach Nurse Communication: Mobility status PT Visit Diagnosis: Muscle weakness (generalized) (M62.81);Difficulty in walking, not elsewhere classified (R26.2);Unsteadiness on feet (R26.81)    Time: 0355-9741 PT Time Calculation (min) (ACUTE ONLY): 37 min   Charges:   PT Evaluation $PT Eval Low Complexity: 1 Low PT Treatments $Therapeutic Exercise: 8-22 mins $Therapeutic Activity: 8-22 mins        Malachi Pro, DPT 09/06/2020, 12:47 PM

## 2020-09-06 NOTE — Evaluation (Signed)
Occupational Therapy Evaluation Patient Details Name: Maria Norris MRN: [id number] DOB: 23-Feb-1956 Today's Date: 09/06/2020    History of Present Illness 65 y.o. female with medical history significant of stroke, hypertension, hyperlipidemia, diabetes mellitus, stroke, childhood seizure (did not have seizure since age of 34), CAD, former smoker (quit smoking 1 week ago), bilateral carotid artery stenosis (s/p of right carotid endarterectomy 12/2019), chronic back pain, who presents with altered mental status/weakness/tremors.   Clinical Impression   Patient presenting with decreased I in self care, balance, functional mobility/transfers, endurance, and safety awareness. Patient's sister present in the room. She reports pt's baseline as being independent without use of AD. Pt lives with her sister and brother in law PTA. Patient very lethargic with sister being concerned it is medication related. OT relayed this message to RN. Sister does report that pt sleeps heavily in the afternoon, like she is now, after taking PM medications. Pt performs bed mobility with min A to EOB. She is eager to attempt standing again. Pt stands with min - mod A from EOB without use of AD. Pt having difficulty understanding verbal directions of side steps and after given demonstration she is able to side step up and back down bed with 1 initial knee buckle but then with min A. Pt sitting to take rest break and completes again without knee buckle. Pt returning to supine and falls quickly back to sleep. Pt still not close to baseline and therapist recommends SNF to address functional deficits. Pt and family prefer HH with hopes pt will be able to progress to going home with continued therapy intervention.  Patient will benefit from acute OT to increase overall independence in the areas of ADLs, functional mobility, and safety awareness in order to safely discharge to next venue of care.    Follow Up Recommendations  SNF     Equipment Recommendations  Other (comment) (defer to next venue of care)       Precautions / Restrictions Precautions Precautions: Fall      Mobility Bed Mobility Overal bed mobility: Needs Assistance Bed Mobility: Supine to Sit;Sit to Supine     Supine to sit: Min assist Sit to supine: Min assist   General bed mobility comments: min A for balance and trunk support for safety with min cuing for hand placement and technique    Transfers Overall transfer level: Needs assistance Equipment used: 1 person hand held assist Transfers: Sit to/from Stand Sit to Stand: Min assist;Mod assist         General transfer comment: without use of RW    Balance Overall balance assessment: Needs assistance Sitting-balance support: Bilateral upper extremity supported Sitting balance-Leahy Scale: Good Sitting balance - Comments: close supervision.   Standing balance support: Bilateral upper extremity supported Standing balance-Leahy Scale: Poor Standing balance comment: reliance on therapist for support                           ADL either performed or assessed with clinical judgement   ADL Overall ADL's : Needs assistance/impaired     Grooming: Wash/dry hands;Wash/dry face;Sitting;Supervision/safety;Set up                                       Vision Patient Visual Report: No change from baseline              Pertinent Vitals/Pain Pain Assessment: No/denies  pain     Hand Dominance Right   Extremity/Trunk Assessment Upper Extremity Assessment Upper Extremity Assessment: Generalized weakness   Lower Extremity Assessment Lower Extremity Assessment: Generalized weakness       Communication Communication Communication: Expressive difficulties;Other (comment) (slurred speech)   Cognition Arousal/Alertness: Lethargic;Suspect due to medications Behavior During Therapy: Anxious Overall Cognitive Status: Impaired/Different from baseline                                  General Comments: Family present reports pt is "on too much pain medication. This is not normal for her".              Home Living Family/patient expects to be discharged to:: Private residence Living Arrangements: Other relatives Available Help at Discharge: Available PRN/intermittently   Home Access: Stairs to enter Entrance Stairs-Number of Steps: 4 Entrance Stairs-Rails: Right;Left Home Layout: One level     Bathroom Shower/Tub: Runner, broadcasting/film/video: None          Prior Functioning/Environment Level of Independence: Independent        Comments: Pt's sister present who reports pt does everything independently at home without use of AD.        OT Problem List: Decreased strength;Impaired balance (sitting and/or standing);Decreased cognition;Decreased knowledge of precautions;Decreased safety awareness;Cardiopulmonary status limiting activity;Decreased activity tolerance;Decreased knowledge of use of DME or AE      OT Treatment/Interventions: Self-care/ADL training;Manual therapy;Therapeutic exercise;Modalities;Patient/family education;Balance training;Energy conservation;Therapeutic activities;Cognitive remediation/compensation;DME and/or AE instruction;Neuromuscular education    OT Goals(Current goals can be found in the care plan section) Acute Rehab OT Goals Patient Stated Goal: get back home OT Goal Formulation: With patient Time For Goal Achievement: 09/20/20 Potential to Achieve Goals: Fair ADL Goals Pt Will Perform Grooming: with min guard assist;standing Pt Will Perform Lower Body Dressing: with min guard assist;sit to/from stand Pt Will Transfer to Toilet: with min guard assist;ambulating Pt Will Perform Toileting - Clothing Manipulation and hygiene: with min guard assist;sit to/from stand Pt Will Perform Tub/Shower Transfer: with min guard assist;shower seat;ambulating  OT Frequency: Min  2X/week   Barriers to D/C:    none known at this time          AM-PAC OT "6 Clicks" Daily Activity     Outcome Measure Help from another person eating meals?: A Little Help from another person taking care of personal grooming?: A Little Help from another person toileting, which includes using toliet, bedpan, or urinal?: A Lot Help from another person bathing (including washing, rinsing, drying)?: A Little Help from another person to put on and taking off regular upper body clothing?: A Little Help from another person to put on and taking off regular lower body clothing?: A Lot 6 Click Score: 16   End of Session Equipment Utilized During Treatment: Oxygen Nurse Communication: Mobility status  Activity Tolerance: Patient tolerated treatment well Patient left: in bed;with call bell/phone within reach;with bed alarm set;with family/visitor present  OT Visit Diagnosis: Unsteadiness on feet (R26.81);Muscle weakness (generalized) (M62.81)                Time: 6213-0865 OT Time Calculation (min): 21 min Charges:  OT General Charges $OT Visit: 1 Visit OT Evaluation $OT Eval Moderate Complexity: 1 Mod OT Treatments $Therapeutic Activity: 8-22 mins  Jackquline Denmark, MS, OTR/L , CBIS ascom [phone]  09/06/20, 4:22 PM

## 2020-09-06 NOTE — Progress Notes (Signed)
Sister Andrey Campanile at bedside with patient. Sister updated this RN about patient's living situation and availability of help for patient at home. Family would like to have [facility] services. Patient recently seen at [facility] to set up at new patient with neurology. Family had requested patient be transferred to [facility], I am not aware of why patient could not be transferred and deferred to MD Rai for help with family/staff communication. MD Rai came to bedside, spoke with [facility] and has achieved clear communication about patient's medical history and current needs. Sandy requested, per [facility], for patient to only receive Tylenol, not Toradol nor Dilaudid. This Rn made MD [facility] aware, MD discontinued those 2 pain medications. This RN gave Tylenol for chronic back pain. [facility] Rowan Blase attempted to work with patient this afternoon but patient was too lethargic and declined. Family at bedside and aware. OT worked with patient and gave report to this RN that patient was able to do side-steps at edge of bed. Patient resting in bed at this time without any needs. Sandwich tray delivered to patient by this RN around 1430.

## 2020-09-07 ENCOUNTER — Inpatient Hospital Stay: Payer: Medicare Other

## 2020-09-07 ENCOUNTER — Encounter: Payer: Self-pay | Admitting: Internal Medicine

## 2020-09-07 DIAGNOSIS — E1169 Type 2 diabetes mellitus with other specified complication: Secondary | ICD-10-CM

## 2020-09-07 DIAGNOSIS — E785 Hyperlipidemia, unspecified: Secondary | ICD-10-CM

## 2020-09-07 DIAGNOSIS — R072 Precordial pain: Secondary | ICD-10-CM

## 2020-09-07 DIAGNOSIS — R42 Dizziness and giddiness: Secondary | ICD-10-CM

## 2020-09-07 DIAGNOSIS — E781 Pure hyperglyceridemia: Secondary | ICD-10-CM

## 2020-09-07 LAB — BASIC METABOLIC PANEL
Anion gap: 7 (ref 5–15)
BUN: 24 mg/dL — ABNORMAL HIGH (ref 8–23)
CO2: 31 mmol/L (ref 22–32)
Calcium: 9.4 mg/dL (ref 8.9–10.3)
Chloride: 99 mmol/L (ref 98–111)
Creatinine, Ser: 0.64 mg/dL (ref 0.44–1.00)
GFR, Estimated: >60 mL/min (ref >60)
Glucose, Bld: 149 mg/dL — ABNORMAL HIGH (ref 70–99)
Potassium: 4.7 mmol/L (ref 3.5–5.1)
Sodium: 137 mmol/L (ref 135–145)

## 2020-09-07 LAB — CBC
HCT: 36.8 % (ref 36.0–46.0)
Hemoglobin: 12.8 g/dL (ref 12.0–15.0)
MCH: 32.9 pg (ref 26.0–34.0)
MCHC: 34.8 g/dL (ref 30.0–36.0)
MCV: 94.6 fL (ref 80.0–100.0)
Platelets: 233 10*3/uL (ref 150–400)
RBC: 3.89 MIL/uL (ref 3.87–5.11)
RDW: 11.9 % (ref 11.5–15.5)
WBC: 7.9 10*3/uL (ref 4.0–10.5)
nRBC: 0 % (ref 0.0–0.2)

## 2020-09-07 LAB — TROPONIN I (HIGH SENSITIVITY)
Troponin I (High Sensitivity): 13 ng/L (ref <18)
Troponin I (High Sensitivity): 15 ng/L (ref <18)

## 2020-09-07 LAB — GLUCOSE, CAPILLARY
Glucose-Capillary: 140 mg/dL — ABNORMAL HIGH (ref 70–99)
Glucose-Capillary: 182 mg/dL — ABNORMAL HIGH (ref 70–99)
Glucose-Capillary: 243 mg/dL — ABNORMAL HIGH (ref 70–99)
Glucose-Capillary: 161 mg/dL — ABNORMAL HIGH (ref 70–99)

## 2020-09-07 LAB — D-DIMER, QUANTITATIVE: D-Dimer, Quant: 0.3 ug/mL-FEU (ref 0.00–0.50)

## 2020-09-07 LAB — MAGNESIUM: Magnesium: 1.8 mg/dL (ref 1.7–2.4)

## 2020-09-07 MED ORDER — HEPARIN (PORCINE) 25000 UT/250ML-% IV SOLN
800.0000 [IU]/h | INTRAVENOUS | Status: DC
  Administered 2020-09-07: 800 [IU]/h via INTRAVENOUS
  Filled 2020-09-07: qty 250

## 2020-09-07 MED ORDER — AMOXICILLIN-POT CLAVULANATE 875-125 MG PO TABS
1.0000 | ORAL_TABLET | Freq: Two times a day (BID) | ORAL | Status: DC
  Administered 2020-09-07 – 2020-09-08 (×2): 1 via ORAL
  Filled 2020-09-07 (×2): qty 1

## 2020-09-07 MED ORDER — HEPARIN BOLUS VIA INFUSION
4000.0000 [IU] | Freq: Once | INTRAVENOUS | Status: AC
  Administered 2020-09-07: 4000 [IU] via INTRAVENOUS
  Filled 2020-09-07: qty 4000

## 2020-09-07 MED ORDER — MECLIZINE HCL 12.5 MG PO TABS
12.5000 mg | ORAL_TABLET | Freq: Three times a day (TID) | ORAL | Status: DC
  Filled 2020-09-07 (×3): qty 1

## 2020-09-07 MED ORDER — MECLIZINE HCL 12.5 MG PO TABS
12.5000 mg | ORAL_TABLET | Freq: Three times a day (TID) | ORAL | Status: DC | PRN
  Administered 2020-09-07: 12.5 mg via ORAL
  Filled 2020-09-07 (×2): qty 1

## 2020-09-07 MED ORDER — CARVEDILOL 12.5 MG PO TABS
12.5000 mg | ORAL_TABLET | Freq: Two times a day (BID) | ORAL | Status: DC
  Administered 2020-09-07 – 2020-09-08 (×2): 12.5 mg via ORAL
  Filled 2020-09-07 (×2): qty 1

## 2020-09-07 MED ORDER — MORPHINE SULFATE (PF) 2 MG/ML IV SOLN
2.0000 mg | INTRAVENOUS | Status: DC | PRN
  Administered 2020-09-07: 2 mg via INTRAVENOUS
  Filled 2020-09-07: qty 1

## 2020-09-07 NOTE — Plan of Care (Signed)

## 2020-09-07 NOTE — Progress Notes (Signed)
Neurology Progress Note  Patient ID: 65 yo with hx prior strokes and multiple cerebrovascular risk factors presented 09/04/20 with acute confusional episode.  Interval hx:  - Yesterday evening patient's mental status had nearly improved to her recent baseline after the dilaudid had worn off. - No further events c/f seizure - Plan was to discharge patient home today however during rounds she developed acute onset severe chest pressure. Cardiology evaluating her now for ACS. - Per family request I have reached out to Dr. Janann August her [facility] neurologist to update him - No further neurologic needs at this time  Data:  MRI brain No acute or subacute brain infarction. 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  MRA H&N  MR angiography does not large vessel anterior circulation occlusion. There are stenoses at the right MCA bifurcation, which could place the patient at risk of right MCA branch vessel infarction.  No antegrade flow in the left vertebral artery. Atherosclerotic narrowing of the PCA branches, left more than right  CNS imaging personally reviewed  Routine EEG 09/05/20 - personally reviewed and interpreted  This EEG was obtained while awake and asleep and is abnormal due to mild diffuse slowing with superimposed focal L temporo-parietal slowing, indicating global cerebral dysfunction as well as focal dysfunction in the site of prior ischemic damage.   There were occasional sharply contoured discharges in the left temporo-parietal region that did not disrupt the background, did not have a significant field, and were not definitively epileptiform. There were no electrographic seizures identified. If further characterization would be helpful clinically, consider prolonged EEG monitoring with video.   Exam:  Vitals:   09/07/20 1036 09/07/20 1152  BP: (!) 185/40 (!) 175/40  Pulse:  92  Resp:  16  Temp: 98.8 F (37.1 C) 98.1 F (36.7 C)   SpO2: 92% 92%   Gen: In bed, clutching chest, appears distressed Resp: normal WOB Abd: soft, nt  Mental Status: Patient is awake, alert, tearful, oriented x4, follows simple commands Speech: no dysarthria, no aphasia, able to name and repeat Cranial Nerves: II: Visual Fields are full. Pupils are equal, round, and reactive to light.   III,IV, VI: EOMI without ptosis or diploplia.  No significant nystagmus V: Facial sensation is symmetric to eyelash brush VII: Facial movement is symmetric  VIII: hearing is intact to voice X: Uvula elevates symmetrically XI: Unable to assess secondary to patient's mental status /participation XII: tongue is midline without atrophy or fasciculations.  Motor: Unable to perform formal strength testing 2/2 pt agitation 2/2 chest pain however she is able to move all extremities well at least anti-gravity  Sensory: SILT Deep Tendon Reflexes:2+ and symmetric in the biceps and patellae.  Plantars:Toes are mute on the right and upgoing on left Cerebellar, gait: UTA 2/2 AMS   Impression: 65 yo with hx childhood seizures, resolved and single adult seizure Nov 2021 in setting of acute stroke p/w multiple acute confusional episodes c/f focal seizure with impaired awareness. By yesterday evening her mental status had improved nearly to her recent baseline. She is now undergoing evaluation for ACS but she has no further inpatient neurologic needs. I will stop by tomorrow AM to update her on my discussion with Dr. Janann August, after that plan to sign off. She can f/u with Dr. Janann August as scheduled.  Recommendations:  - S/p keppra load 20 mg/kg - Continue keppra 750mg  bid - Patient has had multiple seizures at this point and has  abnormal brain MRI and EEG findings. I strongly recommended that she continue taking antiseizure medication indefinitely (forever) bc she will be at high risk of recurrent event if she goes off her seizure medications. She agreed. - No changes to  current medication regimen for secondary stroke prevention (ASA 325mg  antiplatelet monotherapy per Dr. and Dr. Janann August her vascular surgeon + atorvastatin 40mg  daily) - Patient is unable to drive, operate heavy machinery, perform activities at heights or participate in water activities for at least 6 mos after last seizure and also until release by outpatient physician. This should be reiterated to patient at hospital discharge and also written in discharge instructions  Mosetta Putt, MD [facility] [phone]  If 7pm- 7am, please page neurology on call as listed in AMION.

## 2020-09-07 NOTE — Plan of Care (Signed)
24 Hour urine collection begen 5/11 @ 1430 and will end 5/12 @ 1430.  Collection container labeled and place don ice in patient's room.  NT also informed and asked to pass in report at shift change.  Patient and family also aware.

## 2020-09-07 NOTE — Plan of Care (Signed)
Patient BP was 182/57 this AM.  Gave Hydrolazine IV PRN and daily BP PO meds.  At 1000 called to room since patient feeling weak, dizzy and "about to pass out."  Dinamap attempted 2x, but unable to read BP.  Manual taken Rt and LF arm 180/35.  - Dr. Informed.

## 2020-09-07 NOTE — Plan of Care (Addendum)
Patient reporting chest pain, light headed, "gonna pass out", diaphoretic.  Dr. On floor and informed, rounded/assessed.  Notro given, stat EKG ordered and completed.  Gave 3 Nitro x3 - no change in chest pain.  Patient becoming slightly more lethargic.  Cardio consulted.  Morphine added and to be given.  Starting on Hep drip.

## 2020-09-07 NOTE — Progress Notes (Addendum)
Patient ID: Maria FewNancy Hiegel, female   DOB: October 04, 1955, 65 y.o.   MRN: [id number] [facility] PROGRESS NOTE  Maria Norris [id number] DOB: October 04, 1955 DOA: 09/04/2020 PCP: Pcp, No  HPI/Subjective: Patient's blood pressure was high this morning was given IV hydralazine.  Little later she felt like she was going to pass out.  She stated she felt lightheaded and dizzy like the room was spinning.  A little later she then complained of chest pain.  She was given 3 nitroglycerin without relief and given a dose of morphine and later on in the day she felt better.  Objective: Vitals:   09/07/20 1036 09/07/20 1152  BP: (!) 185/40 (!) 175/40  Pulse:  92  Resp:  16  Temp: 98.8 F (37.1 C) 98.1 F (36.7 C)  SpO2: 92% 92%    Intake/Output Summary (Last 24 hours) at 09/07/2020 1450 Last data filed at 09/07/2020 1020 Gross per 24 hour  Intake 833.06 ml  Output 3250 ml  Net -2416.94 ml   Filed Weights   09/04/20 1445 09/05/20 1747  Weight: 79.2 kg 70.8 kg    ROS: Review of Systems  Respiratory: Positive for shortness of breath.   Cardiovascular: Positive for chest pain.  Gastrointestinal: Negative for abdominal pain, nausea and vomiting.  Neurological: Positive for dizziness.   Exam: Physical Exam HENT:     Head: Normocephalic.     Mouth/Throat:     Pharynx: No oropharyngeal exudate.  Eyes:     General: Lids are normal.     Conjunctiva/sclera: Conjunctivae normal.     Pupils: Pupils are equal, round, and reactive to light.  Cardiovascular:     Rate and Rhythm: Normal rate and regular rhythm.     Heart sounds: Normal heart sounds, S1 normal and S2 normal.  Pulmonary:     Breath sounds: Normal breath sounds. No decreased breath sounds, wheezing, rhonchi or rales.  Abdominal:     Palpations: Abdomen is soft.     Tenderness: There is no abdominal tenderness.  Musculoskeletal:     Right lower leg: No swelling.     Left lower leg: No swelling.  Skin:    General: Skin is warm.      Findings: No rash.  Neurological:     Mental Status: She is alert and oriented to person, place, and time.     Comments: Horizontal nystagmus with extraocular movement.       Data Reviewed: Basic Metabolic Panel: Recent Labs  Lab 09/04/20 1405 09/04/20 1453 09/05/20 0721 09/06/20 0500 09/07/20 0540 09/07/20 1114  NA  --  139 137 136 137  --   K  --  4.3 4.3 4.6 4.7  --   CL  --  102 99 94* 99  --   CO2  --  29 27 31 31   --   GLUCOSE  --  193* 237* 162* 149*  --   BUN  --  27* 35* 36* 24*  --   CREATININE 0.80 0.69 0.63 0.97 0.64  --   CALCIUM  --  9.7 9.4 9.7 9.4  --   MG  --   --   --   --   --  1.8   Liver Function Tests: Recent Labs  Lab 09/04/20 1453  AST 23  ALT 27  ALKPHOS 38  BILITOT 0.7  PROT 7.5  ALBUMIN 3.9   Recent Labs  Lab 09/05/20 1806  AMMONIA <9*   CBC: Recent Labs  Lab 09/04/20 1453 09/05/20 0721 09/06/20  0500 09/07/20 0540  WBC 7.8 16.0* 10.9* 7.9  NEUTROABS 4.7  --   --   --   HGB 14.4 15.1* 13.7 12.8  HCT 40.5 42.4 40.5 36.8  MCV 91.6 93.2 96.2 94.6  PLT 286 271 281 233   BNP (last 3 results) Recent Labs    09/05/20 1806  BNP 280.4*    CBG: Recent Labs  Lab 09/06/20 1620 09/06/20 1723 09/06/20 2007 09/07/20 0734 09/07/20 1147  GLUCAP 199* 197* 238* 140* 182*    Recent Results (from the past 240 hour(s))  Resp Panel by RT-PCR (Flu A&B, Covid) Nasopharyngeal Swab     Status: None   Collection Time: 09/04/20  2:53 PM   Specimen: Nasopharyngeal Swab; Nasopharyngeal(NP) swabs in vial transport medium  Result Value Ref Range Status   SARS Coronavirus 2 by RT PCR NEGATIVE NEGATIVE Final    Comment: (NOTE) SARS-CoV-2 target nucleic acids are NOT DETECTED.  The SARS-CoV-2 RNA is generally detectable in upper respiratory specimens during the acute phase of infection. The lowest concentration of SARS-CoV-2 viral copies this assay can detect is 138 copies/mL. A negative result does not preclude SARS-Cov-2 infection  and should not be used as the sole basis for treatment or other patient management decisions. A negative result may occur with  improper specimen collection/handling, submission of specimen other than nasopharyngeal swab, presence of viral mutation(s) within the areas targeted by this assay, and inadequate number of viral copies(<138 copies/mL). A negative result must be combined with clinical observations, patient history, and epidemiological information. The expected result is Negative.  Fact Sheet for Patients:  BloggerCourse.com  Fact Sheet for Healthcare Providers:  SeriousBroker.it  This test is no t yet approved or cleared by the [place] FDA and  has been authorized for detection and/or diagnosis of SARS-CoV-2 by FDA under an Emergency Use Authorization (EUA). This EUA will remain  in effect (meaning this test can be used) for the duration of the COVID-19 declaration under Section 564(b)(1) of the Act, 21 U.S.C.section 360bbb-3(b)(1), unless the authorization is terminated  or revoked sooner.       Influenza A by PCR NEGATIVE NEGATIVE Final   Influenza B by PCR NEGATIVE NEGATIVE Final    Comment: (NOTE) The Xpert Xpress SARS-CoV-2/FLU/RSV plus assay is intended as an aid in the diagnosis of influenza from Nasopharyngeal swab specimens and should not be used as a sole basis for treatment. Nasal washings and aspirates are unacceptable for Xpert Xpress SARS-CoV-2/FLU/RSV testing.  Fact Sheet for Patients: BloggerCourse.com  Fact Sheet for Healthcare Providers: SeriousBroker.it  This test is not yet approved or cleared by the [place] FDA and has been authorized for detection and/or diagnosis of SARS-CoV-2 by FDA under an Emergency Use Authorization (EUA). This EUA will remain in effect (meaning this test can be used) for the duration of the COVID-19 declaration  under Section 564(b)(1) of the Act, 21 U.S.C. section 360bbb-3(b)(1), unless the authorization is terminated or revoked.  Performed at [facility], [address], [place], [place] [address]      Studies: [facility] 1 View  Result Date: 09/07/2020 CLINICAL DATA:  Fever EXAM: PORTABLE CHEST 1 VIEW COMPARISON:  Sep 05, 2020 FINDINGS: Lungs are clear. There is cardiomegaly with pulmonary vascularity normal. No adenopathy. There is aortic atherosclerosis. No bone lesions. IMPRESSION: Cardiomegaly.  Lungs clear. Aortic Atherosclerosis (ICD10-I70.0). Electronically Signed   By: Bretta Bang III M.D.   On: 09/07/2020 09:49   EEG adult  Result Date:  09/05/2020 Jefferson Fuel, MD     09/05/2020  5:48 PM Routine EEG Report Verba Ainley is a 65 y.o. female with a history of remote multifocal ischemic infarcts and seizures who is undergoing an EEG to evaluate for seizures. Report: This EEG was acquired with electrodes placed according to the International 10-20 electrode system (including Fp1, Fp2, F3, F4, C3, C4, P3, P4, O1, O2, T3, T4, T5, T6, A1, A2, Fz, Cz, Pz). The following electrodes were missing or displaced: none. The occipital dominant rhythm was 6-7 Hz with overriding faster frequencies in the beta range throughout the recording. This activity is reactive to stimulation. Drowsiness was manifested by background fragmentation; deeper stages of sleep were characterized by sleep spindles and K complexes. There was occasional slowing over the L temporo-parietal region. There were occasional sharply contoured discharges in the left temporo-parietal region that did not disrupt the background, did not have a significant field, and were not definitively epileptiform. There were no electrographic seizures identified. There was no abnormal response to photic stimulation or hyperventilation. Impression & clinical correlation: This EEG was obtained while awake and asleep and is abnormal due to  mild diffuse slowing with superimposed focal L temporo-parietal slowing, indicating global cerebral dysfunction as well as focal dysfunction in the site of prior ischemic damage. There were occasional sharply contoured discharges in the left temporo-parietal region that did not disrupt the background, did not have a significant field, and were not definitively epileptiform. There were no electrographic seizures identified. If further characterization would be helpful clinically, consider prolonged EEG monitoring with video.   Bing Neighbors, MD [facility] [phone] If 7pm- 7am, please page neurology on call as listed in AMION.    Scheduled Meds: . amoxicillin-clavulanate  1 tablet Oral Q12H  . aspirin  325 mg Oral Daily  . atorvastatin  40 mg Oral Daily  . carvedilol  12.5 mg Oral BID WC  . cloNIDine  0.1 mg Oral BID  . ezetimibe  10 mg Oral Daily  . gabapentin  800 mg Oral TID  . insulin aspart  0-5 Units Subcutaneous QHS  . insulin aspart  0-9 Units Subcutaneous TID WC  . insulin glargine  12 Units Subcutaneous QHS  . isosorbide mononitrate  30 mg Oral Daily  . isosorbide-hydrALAZINE  1 tablet Oral TID  . losartan  100 mg Oral Daily  . magnesium oxide  400 mg Oral Daily  . multivitamin with minerals  1 tablet Oral Daily  . NIFEdipine  60 mg Oral Daily  . pantoprazole  40 mg Oral Daily  . vitamin B-12  100 mcg Oral Daily   Continuous Infusions: . sodium chloride Stopped (09/06/20 2030)  . levETIRAcetam 750 mg (09/07/20 2671)  . promethazine (PHENERGAN) injection (IM or IVPB) Stopped (09/05/20 0908)    Assessment/Plan:  1. Acute chest pain this morning.  Patient already on aspirin and beta-blocker and atorvastatin.  Started on heparin drip.  Cardiac enzymes x2 negative.  D-dimer also negative.  Appreciate cardiology consultation.  EKG reviewed and showed some lateral ST depression.  This afternoon patient feeling better when I spoke with her on the phone. 2. Vertigo.   As needed meclizine.  Tympanic membrane on the left erythematous and bulging will start Augmentin today. 3. Acute metabolic encephalopathy.  Appreciate neurology work-up.  Likely due to pain medications earlier in the hospital course 4. Hypertensive urgency on numerous medications.  Today I do not mind blood pressure being a little bit on the higher side.  24-hour  urine for metanephrines.  Serum metanephrines tomorrow. 5. Type 2 diabetes mellitus with hyperlipidemia.  Holding metformin while inpatient.  Continue atorvastatin.  Hemoglobin A1c 7.7 6. History of CAD and hypertriglyceridemia.  On Lipitor.  Triglycerides 677.  Consider Tricor as outpatient. 7. Seizures on Keppra  Code Status:     Code Status Orders  (From admission, onward)         Start     Ordered   09/05/20 0347  Full code  Continuous        09/05/20 0346        Code Status History    This patient has a current code status but no historical code status.   Advance Care Planning Activity     Family Communication: Spoke with family at the bedside on 2 occasions today Disposition Plan: Status is: Inpatient  Dispo: The patient is from: Home              Anticipated d/c is to: Home              Patient currently with acute chest pain and vertigo today we will continue to watch here in the hospital today and reassess tomorrow   Difficult to place patient.  No.  Consultants:  Cardiology  Neurology  Antibiotics:  Augmentin  Time spent: 37 minutes  Jovane Foutz [employer]

## 2020-09-07 NOTE — Progress Notes (Signed)
OT Cancellation Note  Patient Details Name: Maria Norris MRN: [id number] DOB: 1956/01/06   Cancelled Treatment:    Reason Eval/Treat Not Completed: Medical issues which prohibited therapy. Pt with elevated BP of 182/57 and pt reporting chest pain per RN. OT to hold for safety and further assessment of pt. OT to re-attempt when pt is able to safely and actively participate in OT intervention.   Jackquline Denmark, MS, OTR/L , CBIS ascom [phone]  09/07/20, 10:37 AM   09/07/2020, 10:36 AM

## 2020-09-07 NOTE — Consult Note (Addendum)
Cardiology Consult    Patient ID: Maria Norris MRN: [id number], DOB/AGE: 1955/08/21   Admit date: 09/04/2020 Date of Consult: 09/07/2020  Primary Physician: Pcp, No Primary Cardiologist: None - pt plans to f/u @ [facility] Requesting Provider: R. Wieting, MD  Patient Profile    Maria Norris is a 65 y.o. female with a history of CAD, HTN, HL, DMII, atypical chest pain, fibromyalgia, hypertropic cardiomyopathy, HFpEF, carotid dzs s/p L CEA, stroke, PE (05/2017), anxiety, depression, tob abuse, and COPD, who is being seen today for the evaluation of chest pain at the request of Dr. Renae Gloss.  Past Medical History   Past Medical History:  Diagnosis Date  . (HFpEF) heart failure with preserved ejection fraction (HCC)    a. 03/2020 Echo: EF >65%, diast dysfxn; b. 08/2020 Echo: EF 55-60%, no rwma, gr1 DD, mod LVH. Nl RV size/fxn. Mild LAE, Mild AS.  Marland Kitchen Anxiety   . Carotid arterial disease (HCC)    a. 02/2020 s/p L CEA; b. 02/2020 CTA: bilat plaque w/o hemodynamically significant dzs; c. 07/2020 U/S: RICA/RCCA/RECA <50%. L ICA nl, LCCA/ECA <50%. L vert near-occlusion/occlusion.  Marland Kitchen COPD (chronic obstructive pulmonary disease) (HCC)   . Coronary artery disease    a. 1990 Cath: LAD 20-40. OM 100 w/ L->L collats; b. 04/2004 Cath: LCX 25; c. 03/2009 Cath: min irregs in dLCX (<20%). EF 65%; d. 09/2017 Cath: Lateral OM2 100 (small, old), RCA 25p/m/d.  Marland Kitchen Depression   . Diabetes mellitus without complication (HCC)   . Fibromyalgia   . HLD (hyperlipidemia)   . Hypertension   . Hypertrophic cardiomyopathy (HCC)   . Insomnia   . Lung nodule   . Mild Aortic stenosis    a. 08/2020 Echo: Mild AS. Nl EF.  . Morbid obesity (HCC)   . Pulmonary embolism (HCC)    a. 05/2017 - treated w/ Eliquis.  Marland Kitchen Restless legs syndrome   . Seizures (HCC)   . Stroke (HCC)    a. 02/2020 L pontine and L MCA strokes. TEE neg for clot.  . Thoracic spondylosis   . Tobacco abuse     Past Surgical History:  Procedure Laterality Date   . BACK SURGERY    . CHOLECYSTECTOMY    . TUBAL LIGATION       Allergies  Allergies  Allergen Reactions  . Erythromycin Other (See Comments) and Rash    Unknown  Unknown    . Codeine   . Paroxetine   . Sertraline Other (See Comments)    Suicidal thoughts Suicidal thoughts   . Wellbutrin [Bupropion]   . Buspirone Rash    History of Present Illness    65 y.o. ? w/ a h/o CAD, HTN, HL, DMII, atypical chest pain, fibromyalgia, hypertropic cardiomyopathy, HFpEF, carotid dzs s/p L CEA, stroke, PE (05/2017), anxiety, depression, tob abuse, and COPD.  Per reports (CareEverywhere), cardiac hx dates back to 1990, at which time she underwent cath revealing an occlusion of a branch of the OM1, and she was treated medically.  She has a long h/o chest pain w/ multiple normal stress tests and caths that have always shown stable, nonobs dzs (04/2004 & 03/2009).  Most recent cath was performed in 09/2017 in the setting of ongoing chest pain despite nl stress test.  This again revealed an occlusion of the lateral branch of an obtuse marginal and diffuse 25% stenoses in the RCA.  The cath report does not mention LAD/LCX findings.  She was medically managed.  In the Fall of 2021,  she was admitted for stroke and found to have L pontine and L MCA territory infarcts.  She was also found to have severe L carotid dzs and underwent CEA.  Following her stroke, her sister decided to bring her home w/ her in [place], where the pt has lived for the past few months.  At baseline, she is relatively sedentary.  She experiences episodes of chest pain a few x/wk, usually mild and lasting short periods of time.  Pain predominantly occurs @ rest.  She last saw her prior cardiologist in 04/2020, at which time she was felt to be stable.  On 5/8, pt was @ [employer] w/ her family and was noted to suddenly become confused w/ decreased responsiveness, and crying.  Family reported that her hands were shaking and that she also c/o a  right sided headache.  She was too weak to ambulate and her sister's husband carried her to the car and brought her home.  There, they checked her BP and found her to be hypertensive.  They became concerned that she was having a stroke and put her back in the car w/ a plan to drive her to [facility].  Unfortunately, while driving, the pt seemed to slump down and become less responsive.  Her sister then pulled over and called 911.  EMS arrived and Ms. Vanleeuwen was taken to the [facility] ED where, labs were unremarkable (HsTrops nl 8  9  9), ECG showed RSR, 65, and early repol, and mild lateral ST depression (in the setting of baseline artifact).  Head CT w/o acute findings.  MRI/A w/ old L pons infarct; R MCA bifurcation dzs noted w/o large vessel occlusions; Bilat PCA dzs, and no antegrade flow in L vertebral.  Pt was admitted and seen by neuro.  EEG was abnl w/ global cerebral dysfunction.  She has been placed on Keppra out of concern focal seizure w/ impaired awareness. Echo this admission showed EF of 55-60%, gr1 diast dysfxn, mod LVH, nl RV size/fxn, and mild AS.  This AM @ ~ 10:30, pt reported left upper chest pain.  ECG performed and similar to admission ECG w/ early repol and mild lateral ST depression.  She has had frequent PVCs on tele this AM w/ couplets and up to three beats of NSVT.  Pt also c/o dizziness, worse w/ rotating her head.  Chest pain is described as severe and is worse w/ palpation along the left sternal border @ the 2-3rd intercostal spaces.  Repeat HsTrop nl @ 13.  Pt denies dyspnea.  Pts sister @ bedside.  Inpatient Medications    . aspirin  325 mg Oral Daily  . atorvastatin  40 mg Oral Daily  . cloNIDine  0.1 mg Oral BID  . ezetimibe  10 mg Oral Daily  . gabapentin  800 mg Oral TID  . insulin aspart  0-5 Units Subcutaneous QHS  . insulin aspart  0-9 Units Subcutaneous TID WC  . insulin glargine  12 Units Subcutaneous QHS  . isosorbide mononitrate  30 mg Oral Daily  .  isosorbide-hydrALAZINE  1 tablet Oral TID  . losartan  100 mg Oral Daily  . magnesium oxide  400 mg Oral Daily  . meclizine  12.5 mg Oral TID  . metoprolol tartrate  25 mg Oral BID  . multivitamin with minerals  1 tablet Oral Daily  . NIFEdipine  60 mg Oral Daily  . pantoprazole  40 mg Oral Daily  . vitamin B-12  100 mcg Oral  Daily    Family History    Family History  Problem Relation Age of Onset  . Diabetes Mellitus II Father   . CAD Father   . Diabetes Mellitus II Sister   . CAD Sister    She indicated that her mother is deceased. She indicated that her father is deceased. She indicated that the status of her sister is unknown.   Social History    Social History   Socioeconomic History  . Marital status: Widowed    Spouse name: Not on file  . Number of children: Not on file  . Years of education: Not on file  . Highest education level: Not on file  Occupational History  . Not on file  Tobacco Use  . Smoking status: Former Smoker    Years: 40.00  . Smokeless tobacco: Never Used  . Tobacco comment: quit 07/2020  Substance and Sexual Activity  . Alcohol use: Never  . Drug use: Never  . Sexual activity: Not on file  Other Topics Concern  . Not on file  Social History Narrative   Now lives w/ sister in [place].  Previously living in [place], [place], but struggled following CVA in late 2021.   Social Determinants of Health   Financial Resource Strain: Not on file  Food Insecurity: Not on file  Transportation Needs: Not on file  Physical Activity: Not on file  Stress: Not on file  Social Connections: Not on file  Intimate Partner Violence: Not on file     Review of Systems    General:  No chills, fever, night sweats or weight changes.  Cardiovascular:  +++ chest pain, +++ chronic dyspnea on exertion w/o complaint of dyspnea currently.  No edema, orthopnea, palpitations, paroxysmal nocturnal dyspnea. Dermatological: No rash, lesions/masses Respiratory: No  cough, +++ chronic dyspnea Urologic: No hematuria, dysuria Abdominal:   No nausea, vomiting, diarrhea, bright red blood per rectum, melena, or hematemesis Neurologic:  No visual changes, wkns, +++ recent changes in mental status prompting admission. All other systems reviewed and are otherwise negative except as noted above.  Physical Exam    Blood pressure (!) 175/40, pulse 92, temperature 98.1 F (36.7 C), temperature source Oral, resp. rate 16, height  (1.575 m), weight 70.8 kg, SpO2 92 %.  General: Pleasant, c/o chest pain/tenderness. Psych: Flat affect. Neuro: Alert and oriented X 3. Moves all extremities spontaneously. HEENT: Normal  Neck: Supple without JVD. Bilat carotid bruits noted.  L CEA scar. Lungs:  Resp regular and unlabored, diminished breath sounds w/ scattered rhonchi. Heart: RRR no s3, s4.  2/6 SEM throughout, loudest @ the upper sternal borders. Abdomen: Obese, soft, non-tender, non-distended, BS + x 4.  Extremities: No clubbing, cyanosis or edema. DP/PT1+, Radials 2+ and equal bilaterally.  Labs    Cardiac Enzymes Recent Labs  Lab 09/04/20 1706 09/04/20 1947 09/04/20 2328 09/07/20 1114  [facility] Lab Results  Component Value Date   WBC 7.9 09/07/2020   HGB 12.8 09/07/2020   HCT 36.8 09/07/2020   MCV 94.6 09/07/2020   PLT 233 09/07/2020    Recent Labs  Lab 09/04/20 1453 09/05/20 0721 09/07/20 0540  NA 139   < > 137  K 4.3   < > 4.7  CL 102   < > 99  CO2 29   < > 31  BUN 27*   < > 24*  CREATININE 0.69   < > 0.64  CALCIUM 9.7   < >  9.4  PROT 7.5  --   --   BILITOT 0.7  --   --   ALKPHOS 38  --   --   ALT 27  --   --   AST 23  --   --   GLUCOSE 193*   < > 149*   < > = values in this interval not displayed.   Lab Results  Component Value Date   CHOL 255 (H) 09/05/2020   HDL 43 09/05/2020   LDLCALC UNABLE TO CALCULATE IF TRIGLYCERIDE OVER 400 mg/dL 16/01/9603   TRIG 540 (H) 09/05/2020     Radiology Studies    DG  Chest 2 View  Result Date: 09/05/2020 CLINICAL DATA:  Shortness of breath. Sudden onset headache, weakness and confusion. EXAM: CHEST - 2 VIEW COMPARISON:  None. FINDINGS: Trachea is midline. Heart is at the upper limits of normal in size to mildly enlarged. Thoracic aorta is calcified. Mild basilar streaky opacification, left greater than right. No dense airspace consolidation or pleural fluid. Degenerative changes in the spine. IMPRESSION: 1. Mild bibasilar atelectasis or scarring. 2.  Aortic atherosclerosis (ICD10-I70.0). Electronically Signed   By: Leanna Battles M.D.   On: 09/05/2020 11:35   CT HEAD WO CONTRAST  Result Date: 09/05/2020 CLINICAL DATA:  Transient ischemic attack (TIA); sing double, slurred speech. Dizziness and double vision with headache. EXAM: CT HEAD WITHOUT CONTRAST TECHNIQUE: Contiguous axial images were obtained from the base of the skull through the vertex without intravenous contrast. COMPARISON:  Contrast head CT 09/04/2020. MRI/MRA head 09/04/2020. FINDINGS: Brain: Cerebral volume is normal for age. Redemonstrated patchy small chronic white matter infarcts within the left centrum semiovale and left greater than right corona radiata. Redemonstrated chronic lacunar infarct within the left pons. There is no acute intracranial hemorrhage. No demarcated cortical infarct. No extra-axial fluid collection. No evidence of intracranial mass. No midline shift. Unchanged asymmetric CSF density prominence posterior to the right cerebellum, measuring 2.3 x 2.6 cm in transaxial dimensions and possibly reflecting an incidental arachnoid cyst. Vascular: No hyperdense vessel.  Atherosclerotic calcifications. Skull: Normal. Negative for fracture or focal lesion. Sinuses/Orbits: Visualized orbits show no acute finding. Trace bilateral ethmoid and right sphenoid sinus mucosal thickening at the imaged levels. Other: Small bilateral mastoid effusions. IMPRESSION: No evidence of acute intracranial  abnormality. Redemonstrated patchy small chronic white matter infarcts within the left centrum semiovale and left greater than right corona radiata. Redemonstrated chronic lacunar infarct within the left pons. Small bilateral mastoid effusions. Electronically Signed   By: Jackey Loge DO   On: 09/05/2020 15:09   MR ANGIO HEAD WO CONTRAST  Result Date: 09/04/2020 CLINICAL DATA:  Sudden onset of confusion and occipital headache. EXAM: MRI HEAD WITHOUT CONTRAST MRA HEAD WITHOUT CONTRAST TECHNIQUE: Multiplanar, multiecho pulse sequences of the brain and surrounding structures were obtained without intravenous contrast. Angiographic images of the head were obtained using MRA technique without contrast. COMPARISON:  Head CT earlier same day FINDINGS: MRI HEAD FINDINGS Brain: Diffusion imaging does not show any acute or subacute infarction. There is old infarction affecting the left side of the upper pons. No focal cerebellar finding. Cerebral hemispheres show old infarction in the deep white matter of the left hemisphere with some gliosis and volume loss. Few old small vessel infarctions in the right hemispheric white matter. No cortical or large vessel territory infarction. No mass lesion, hemorrhage, hydrocephalus or extra-axial collection. Minimal hemosiderin deposition in old left deep white matter infarction posteriorly. Vascular: Major vessels at the base  of the brain show flow. Skull and upper cervical spine: Negative Sinuses/Orbits: Clear/normal Other: Bilateral mastoid effusions. MRA HEAD FINDINGS Both internal carotid arteries are widely patent through the skull base and siphon regions. The anterior and middle cerebral vessels are patent. There are stenoses at the right MCA bifurcation and proximal M2 segments which could place the patient at risk of MCA territory infarction on the right. Right vertebral artery is widely patent to the basilar. No antegrade flow in the left vertebral artery. No basilar  stenosis. Flow is present within both superior cerebellar arteries and both posterior cerebral arteries. Narrowing and irregularity of the PCA branches, left worse than right. IMPRESSION: No acute or subacute brain infarction. 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 MR angiography does not large vessel anterior circulation occlusion. There are stenoses at the right MCA bifurcation, which could place the patient at risk of right MCA branch vessel infarction. No antegrade flow in the left vertebral artery. Atherosclerotic narrowing of the PCA branches, left more than right Electronically Signed   By: Paulina Fusi M.D.   On: 09/04/2020 14:40   MR BRAIN WO CONTRAST  Result Date: 09/04/2020 CLINICAL DATA:  Sudden onset of confusion and occipital headache. EXAM: MRI HEAD WITHOUT CONTRAST MRA HEAD WITHOUT CONTRAST TECHNIQUE: Multiplanar, multiecho pulse sequences of the brain and surrounding structures were obtained without intravenous contrast. Angiographic images of the head were obtained using MRA technique without contrast. COMPARISON:  Head CT earlier same day FINDINGS: MRI HEAD FINDINGS Brain: Diffusion imaging does not show any acute or subacute infarction. There is old infarction affecting the left side of the upper pons. No focal cerebellar finding. Cerebral hemispheres show old infarction in the deep white matter of the left hemisphere with some gliosis and volume loss. Few old small vessel infarctions in the right hemispheric white matter. No cortical or large vessel territory infarction. No mass lesion, hemorrhage, hydrocephalus or extra-axial collection. Minimal hemosiderin deposition in old left deep white matter infarction posteriorly. Vascular: Major vessels at the base of the brain show flow. Skull and upper cervical spine: Negative Sinuses/Orbits: Clear/normal Other: Bilateral mastoid effusions. MRA HEAD FINDINGS Both internal carotid arteries are widely  patent through the skull base and siphon regions. The anterior and middle cerebral vessels are patent. There are stenoses at the right MCA bifurcation and proximal M2 segments which could place the patient at risk of MCA territory infarction on the right. Right vertebral artery is widely patent to the basilar. No antegrade flow in the left vertebral artery. No basilar stenosis. Flow is present within both superior cerebellar arteries and both posterior cerebral arteries. Narrowing and irregularity of the PCA branches, left worse than right. IMPRESSION: No acute or subacute brain infarction. 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 MR angiography does not large vessel anterior circulation occlusion. There are stenoses at the right MCA bifurcation, which could place the patient at risk of right MCA branch vessel infarction. No antegrade flow in the left vertebral artery. Atherosclerotic narrowing of the PCA branches, left more than right Electronically Signed   By: Paulina Fusi M.D.   On: 09/04/2020 14:40   DG Chest Port 1 View  Result Date: 09/07/2020 CLINICAL DATA:  Fever EXAM: PORTABLE CHEST 1 VIEW COMPARISON:  Sep 05, 2020 FINDINGS: Lungs are clear. There is cardiomegaly with pulmonary vascularity normal. No adenopathy. There is aortic atherosclerosis. No bone lesions. IMPRESSION:  Cardiomegaly.  Lungs clear. Aortic Atherosclerosis (ICD10-I70.0). Electronically Signed   By: Bretta Bang III M.D.   On: 09/07/2020 09:49   EEG adult  Result Date: 09/05/2020 Jefferson Fuel, MD     09/05/2020  5:48 PM Routine EEG Report Ilse Billman is a 65 y.o. female with a history of remote multifocal ischemic infarcts and seizures who is undergoing an EEG to evaluate for seizures. Report: This EEG was acquired with electrodes placed according to the International 10-20 electrode system (including Fp1, Fp2, F3, F4, C3, C4, P3, P4, O1, O2, T3, T4, T5, T6, A1, A2, Fz, Cz, Pz). The  following electrodes were missing or displaced: none. The occipital dominant rhythm was 6-7 Hz with overriding faster frequencies in the beta range throughout the recording. This activity is reactive to stimulation. Drowsiness was manifested by background fragmentation; deeper stages of sleep were characterized by sleep spindles and K complexes. There was occasional slowing over the L temporo-parietal region. There were occasional sharply contoured discharges in the left temporo-parietal region that did not disrupt the background, did not have a significant field, and were not definitively epileptiform. There were no electrographic seizures identified. There was no abnormal response to photic stimulation or hyperventilation. Impression & clinical correlation: This EEG was obtained while awake and asleep and is abnormal due to mild diffuse slowing with superimposed focal L temporo-parietal slowing, indicating global cerebral dysfunction as well as focal dysfunction in the site of prior ischemic damage. There were occasional sharply contoured discharges in the left temporo-parietal region that did not disrupt the background, did not have a significant field, and were not definitively epileptiform. There were no electrographic seizures identified. If further characterization would be helpful clinically, consider prolonged EEG monitoring with video.   Bing Neighbors, MD [facility] [phone] If 7pm- 7am, please page neurology on call as listed in AMION.   CT HEAD CODE STROKE WO CONTRAST`  Result Date: 09/04/2020 CLINICAL DATA:  Code stroke. Neuro deficit, acute, stroke suspected. Sudden onset of headache. Personal history of stroke and TIA. EXAM: CT HEAD WITHOUT CONTRAST TECHNIQUE: Contiguous axial images were obtained from the base of the skull through the vertex without intravenous contrast. COMPARISON:  None. FINDINGS: Brain: Subcortical white matter changes are more prominent left than right. No  acute infarct, hemorrhage, or mass lesion is present. Basal ganglia are intact. Insular ribbon is normal. No acute or focal cortical abnormality is present. Posterior fossa arachnoid cyst noted. The brainstem and cerebellum are otherwise normal. The ventricles are of normal size. No significant extraaxial fluid collection is present. Vascular: Atherosclerotic changes are present within the cavernous internal carotid arteries bilaterally. Calcifications are also present at the dural margin of the vertebral arteries. No hyperdense vessel is present. Skull: Calvarium is intact. No focal lytic or blastic lesions are present. No significant extracranial soft tissue lesion is present. Sinuses/Orbits: The paranasal sinuses and mastoid air cells are clear. The globes and orbits are within normal limits. ASPECTS [facility] Stroke Program Early CT Score) - Ganglionic level infarction (caudate, lentiform nuclei, internal capsule, insula, M1-M3 cortex): 7/7 - Supraganglionic infarction (M4-M6 cortex): 3/3 Total score (0-10 with 10 being normal): 10/10 IMPRESSION: 1. Subcortical white matter changes are more prominent left than right. This likely reflects the sequela of chronic microvascular ischemia. 2. No acute intracranial abnormality. 3. ASPECTS is 10/10. These results were called by telephone at the time of interpretation on 09/04/2020 at 2:07 pm to provider Dr. Iver Nestle , who verbally acknowledged these results. Electronically Signed  By: Marin Robertshristopher  Mattern M.D.   On: 09/04/2020 14:08    ECG & Cardiac Imaging    Admission ECG: RSR, 65, early repol, mild lateral ST depression (baseline artifact) - personally reviewed.  ECG dated 09/07/20 @ 1039: RSR, 85, PVCs, early repol, lateral ST depression - personally reviewed Assessment & Plan    1.  Chest pain/CAD:  All available notes in Care Everywhere reviewed.  Pt w/ a long h/o chest pain dating back to at least [id number] w/ known occlusion of a lateral branch of the obtuse  marginal and otw mild nonobs dzs.  Notes indicate that she has a h/o predominantly atypical chest pain w/ multiple negative ischemic evaluations in the past.Last cath in 09/2017 w/ 25% diffuse RCA stenosis.  Pt/family member report that she has episodic c/p several x/wk, usually @ rest, lasting a few mins, and resolving spontaneously.  She was prev followed by cardiology in [place], but now lives in [place] w/ her sister, who is in the process of establishing care for her @ [facility].  She was admitted 5/8 w/ AMS w/ subsequent w/u mostly unremarkable, though EEG was abnl and she is now being treated for seizures w/ keppra.  This AM, she c/o chest pain that is worse w/ palpation along the left sternal border @ the 2nd-3rd intercostal spaces.  She just received IV morphine and pain abated during my exam.  F/u ECG today, similar to admission ECG w/ early repol and mild lateral ST depression. I do note more freq PVCs on tele however.  F/u HsTrop is nl @ 13 (previously 8  9  9).  Echo earlier this admission w/ nl EF.  Trend HsTrop today.  Given chest wall tenderness, provided that HsT remains nl, would no pursue any additional ischemic eval.  Cont asa, statin, zetia,  blocker, and nitrate.  2.  PVCs/NSVT:  This AM, pt w/ frequent PVCs, couplets,and up to 3 beats of NSVT on tele.  Unclear what role this might be playing in her c/o chest pain.  K was nl @ 4.7 this AM.  Will f/u Mg.  With PVCs and ongoing HTN, will change  blocker to carvedilol and escalate dose.  3.  Hypertrophic cardiomyopathy:  Nl EF w/ mod LVH on echo this admission.  Volume stable.  Cont CCB - consider further titration in setting of HTN.  4.  Seizures/H/o stroke:  Per neuro.  On keppra.  5.  Essential HTN:  Family says that this is historically poorly controlled and she has high throughout her admission here.  As above, will switch  blocker to carvedilol.  She is also on losartan 100, nifedipine 60, imdur 30, bidil 20-37.5 bid, and  clonidine 0.1 bid.  Can likely reduce cost by breaking out components of bidil, though w/ poor control, ? compliance.  With h/o HFpEF, would benefit from addition of spiro.  Will defer any additional changes for now given change to  blocker, though she does not likely need to be on both isordil (in bidil) and imdur.  6.  HL/HTG:  On statin. LDL not calculated 2/2 HTG.  7.  DMII:  Per IM.  8.  Carotid dzs: s/p L CEA.  Stable dzs on u/s in April.  Pts sister plans to have her f/u @ [facility].  9.  Tob Abuse/COPD:  Quit smoking ~ 2 wks ago. No active wheezing.     Signed, Nicolasa Duckinghristopher Sherree Shankman, NP 09/07/2020, 12:44 PM  For questions or updates, please contact  Please consult [email or web address] for contact info under Cardiology/STEMI.

## 2020-09-07 NOTE — Consult Note (Signed)
ANTICOAGULATION CONSULT NOTE - Follow Up Consult  Pharmacy Consult for Heparin gtt Indication: chest pain/ACS  Allergies  Allergen Reactions  . Erythromycin Other (See Comments) and Rash    Unknown  Unknown    . Codeine   . Paroxetine   . Sertraline Other (See Comments)    Suicidal thoughts Suicidal thoughts   . Wellbutrin [Bupropion]   . Buspirone Rash    Patient Measurements: Height: 5\' 2"  (157.5 cm) Weight: 70.8 kg (156 lb 1.6 oz) IBW/kg (Calculated) : 50.1 Heparin Dosing Weight: 65.1 kg  Vital Signs: Temp: 98.8 F (37.1 C) (05/11 1036) Temp Source: Oral (05/11 1036) BP: 185/40 (05/11 1036) Pulse Rate: 61 (05/11 0741)  Labs: Recent Labs    09/04/20 1453 09/04/20 1706 09/04/20 1947 09/04/20 2328 09/05/20 0721 09/06/20 0500 09/07/20 0540  HGB 14.4  --   --   --  15.1* 13.7 12.8  HCT 40.5  --   --   --  42.4 40.5 36.8  PLT 286  --   --   --  271 281 233  APTT 30  --   --   --   --   --   --   LABPROT 13.1  --   --   --   --   --   --   INR 1.0  --   --   --   --   --   --   CREATININE 0.69  --   --   --  0.63 0.97 0.64  TROPONINIHS  --  8 9 9   --   --   --     Estimated Creatinine Clearance: 66.4 mL/min (by C-G formula based on SCr of 0.64 mg/dL).   Medications:  PTA/outpatient: No AC PTA; on ASA alone Inpatient: ASA 325mg  QD and enox 40mg  sq qd >> Hep gtt  Heparin Dosing Weight: 65.1 kg  Assessment: 65yo Female w/ h/o CVA, HTN, HLD, DM, childhood seizure (did not have seizures since the age of 60), CAD, former smoker, quit smoking 1 week ago, bilateral carotid artery stenosis (s/p Rt carotid endarterectomy in 12/2019), chronic back pain presented with altered mental status.   Labs: Baseline aPTT 30s on admit. Hgb: 14.4>13.7>12.8;  Plts: [phone];  Trop: 9>13  Date Time HL Rate/Comment   Goal of Therapy:  Heparin level 0.3-0.7 units/ml Monitor platelets by anticoagulation protocol: Yes   Plan:  Give 4000 units bolus x 1 Start heparin  infusion at 800 units/hr Check anti-Xa level in 6 hours and daily while on heparin Continue to monitor H&H and platelets  65yo Maria Norris 09/07/2020,10:54 AM

## 2020-09-08 DIAGNOSIS — G9341 Metabolic encephalopathy: Principal | ICD-10-CM

## 2020-09-08 DIAGNOSIS — I25118 Atherosclerotic heart disease of native coronary artery with other forms of angina pectoris: Secondary | ICD-10-CM

## 2020-09-08 LAB — GLUCOSE, CAPILLARY
Glucose-Capillary: 196 mg/dL — ABNORMAL HIGH (ref 70–99)
Glucose-Capillary: 180 mg/dL — ABNORMAL HIGH (ref 70–99)
Glucose-Capillary: 205 mg/dL — ABNORMAL HIGH (ref 70–99)

## 2020-09-08 LAB — CBC
HCT: 35.9 % — ABNORMAL LOW (ref 36.0–46.0)
Hemoglobin: 12.7 g/dL (ref 12.0–15.0)
MCH: 33 pg (ref 26.0–34.0)
MCHC: 35.4 g/dL (ref 30.0–36.0)
MCV: 93.2 fL (ref 80.0–100.0)
Platelets: 231 10*3/uL (ref 150–400)
RBC: 3.85 MIL/uL — ABNORMAL LOW (ref 3.87–5.11)
RDW: 11.7 % (ref 11.5–15.5)
WBC: 9.1 10*3/uL (ref 4.0–10.5)
nRBC: 0 % (ref 0.0–0.2)

## 2020-09-08 LAB — BASIC METABOLIC PANEL
Anion gap: 9 (ref 5–15)
BUN: 24 mg/dL — ABNORMAL HIGH (ref 8–23)
CO2: 31 mmol/L (ref 22–32)
Calcium: 9.1 mg/dL (ref 8.9–10.3)
Chloride: 93 mmol/L — ABNORMAL LOW (ref 98–111)
Creatinine, Ser: 0.71 mg/dL (ref 0.44–1.00)
GFR, Estimated: >60 mL/min (ref >60)
Glucose, Bld: 177 mg/dL — ABNORMAL HIGH (ref 70–99)
Potassium: 4.8 mmol/L (ref 3.5–5.1)
Sodium: 133 mmol/L — ABNORMAL LOW (ref 135–145)

## 2020-09-08 LAB — CREATININE CLEARANCE, URINE, 24 HOUR
Collection Interval-CRCL: 24 hours
Creatinine Clearance: 88 mL/min (ref 75–115)
Creatinine, 24H Ur: 897 mg/d (ref 600–1800)
Creatinine, Urine: 39 mg/dL
Urine Total Volume-CRCL: 2300 mL

## 2020-09-08 MED ORDER — MECLIZINE HCL 12.5 MG PO TABS: 12.5000 mg | ORAL_TABLET | Freq: Three times a day (TID) | ORAL | 0 refills | Status: DC | PRN

## 2020-09-08 MED ORDER — AMOXICILLIN-POT CLAVULANATE 875-125 MG PO TABS: 1.0000 | ORAL_TABLET | Freq: Two times a day (BID) | ORAL | 0 refills | Status: DC

## 2020-09-08 MED ORDER — GABAPENTIN 400 MG PO CAPS: 800.0000 mg | ORAL_CAPSULE | Freq: Three times a day (TID) | ORAL | 0 refills | Status: DC

## 2020-09-08 MED ORDER — LEVETIRACETAM 750 MG PO TABS
750.0000 mg | ORAL_TABLET | Freq: Two times a day (BID) | ORAL | 0 refills | Status: DC
Start: 2020-09-08 — End: 2023-09-09

## 2020-09-08 MED ORDER — CARVEDILOL 12.5 MG PO TABS: 12.5000 mg | ORAL_TABLET | Freq: Two times a day (BID) | ORAL | 0 refills | Status: DC

## 2020-09-08 MED ORDER — FLEET ENEMA 7-19 GM/118ML RE ENEM: 1.0000 | ENEMA | Freq: Once | RECTAL | Status: DC

## 2020-09-08 NOTE — Care Management Important Message (Signed)
Important Message  Patient Details  Name: Maria Norris MRN: [id number] Date of Birth: [birth year]   Medicare Important Message Given:  Yes     [name] 09/08/2020, 12:40 PM

## 2020-09-08 NOTE — Progress Notes (Signed)
OT Cancellation Note  Patient Details Name: Rilya Longo MRN: [id number] DOB: 16-Mar-1956   Cancelled Treatment:    Reason Eval/Treat Not Completed: Other (comment). Pt working with PT upon attempt. Will re-attempt OT tx at later time as pt is available.  Wynona Canes, MPH, MS, OTR/L ascom [phone] 09/08/20, 10:53 AM

## 2020-09-08 NOTE — Progress Notes (Signed)
Physical Therapy Treatment Patient Details Name: Maria Norris MRN: [id number] DOB: 1955/09/25 Today's Date: 09/08/2020    History of Present Illness 65 y.o. female with medical history significant of stroke, hypertension, hyperlipidemia, diabetes mellitus, stroke, childhood seizure (did not have seizure since age of 65), CAD, former smoker (quit smoking 1 week ago), bilateral carotid artery stenosis (s/p of right carotid endarterectomy 12/2019), chronic back pain, who presents with altered mental status/weakness/tremors.    PT Comments    Pt in bed upon entry, has O2 doffed for room air trial in prep for DC. Pt resting with sats at 85%, slight improvement with transition to EOB sitting, 91% where it stays with short distance steps to door and back, however with household distances,  In hallway, sats drop to 85%, pt without dyspnea. Pt uses IV pole for stability in hall with one hand, but has frank unsteadiness with no hand supported walking- Chartered loss adjuster encourages use of RW at home. Team made aware of O2 needs for DC.   Follow Up Recommendations  SNF;Supervision for mobility/OOB;Home health PT     Equipment Recommendations  Rolling walker with 5" wheels    Recommendations for Other Services       Precautions / Restrictions Precautions Precautions: Fall Restrictions Weight Bearing Restrictions: No    Mobility  Bed Mobility Overal bed mobility: Modified Independent Bed Mobility: Supine to Sit;Sit to Supine     Supine to sit: Modified independent (Device/Increase time) Sit to supine: Modified independent (Device/Increase time)        Transfers Overall transfer level: Needs assistance Equipment used: None Transfers: Sit to/from Stand Sit to Stand: Supervision            Ambulation/Gait Ambulation/Gait assistance: Supervision Gait Distance (Feet): 190 Feet Assistive device: IV Pole       General Gait Details: no buckling today; mild unsteadiness; able to walk 56ft  unassisted, hands free, but has increased sway-pt is aware. Safety at DC discussed   Social research officer, government Rankin (Stroke Patients Only)       Balance Overall balance assessment: Modified Independent;Mild deficits observed, not formally tested Sitting-balance support: No upper extremity supported;Feet supported Sitting balance-Leahy Scale: Good     Standing balance support: No upper extremity supported;During functional activity Standing balance-Leahy Scale: Fair Standing balance comment: able to be safe with IV pole or RW                            Cognition Arousal/Alertness: Awake/alert Behavior During Therapy: WFL for tasks assessed/performed Overall Cognitive Status: Within Functional Limits for tasks assessed                                        Exercises Other Exercises Other Exercises: Pt instructed in ECS to incorporate during sponge bath while standing at sink with setup. Pt required 2 seated rest breaks. SpO2 on room air down to 89% but bounced back to >95% with seated rest break and use of pursed lip breathing.    General Comments        Pertinent Vitals/Pain Pain Assessment: No/denies pain    Home Living                      Prior Function  PT Goals (current goals can now be found in the care plan section) Acute Rehab PT Goals Patient Stated Goal: get back home PT Goal Formulation: With patient Time For Goal Achievement: 09/20/20 Potential to Achieve Goals: Fair Progress towards PT goals: Progressing toward goals    Frequency    Min 2X/week      PT Plan Discharge plan needs to be updated    Co-evaluation              AM-PAC PT "6 Clicks" Mobility   Outcome Measure  Help needed turning from your back to your side while in a flat bed without using bedrails?: None Help needed moving from lying on your back to sitting on the side of a flat bed  without using bedrails?: None Help needed moving to and from a bed to a chair (including a wheelchair)?: A Little Help needed standing up from a chair using your arms (e.g., wheelchair or bedside chair)?: A Little Help needed to walk in hospital room?: A Little Help needed climbing 3-5 steps with a railing? : A Little 6 Click Score: 20    End of Session Equipment Utilized During Treatment: Gait belt Activity Tolerance: Patient tolerated treatment well;No increased pain Patient left: with bed alarm set;with call bell/phone within reach Nurse Communication: Mobility status PT Visit Diagnosis: Muscle weakness (generalized) (M62.81);Difficulty in walking, not elsewhere classified (R26.2);Unsteadiness on feet (R26.81)     Time: 0601-5615 PT Time Calculation (min) (ACUTE ONLY): 28 min  Charges:  $Gait Training: 23-37 mins                     1:19 PM, 09/08/20 Maria Norris, PT, DPT Physical Therapist - [facility]  [phone] (ASCOM)    Maria Norris 09/08/2020, 1:14 PM

## 2020-09-08 NOTE — Progress Notes (Signed)
Neurology Progress Note  Patient ID: 65 yo with hx prior strokes and multiple cerebrovascular risk factors presented 09/04/20 with acute confusional episode.  Interval hx:  - Mental status back to baseline - No further events c/f seizure - Cardiac w/u yesterday negative - I spoke to Dr. Janann August her [facility] neurologist by phone, he agrees with plan and will f/u with her in clinic - Discharge planned today  Data:  MRI brain No acute or subacute brain infarction. 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  MRA H&N  MR angiography does not large vessel anterior circulation occlusion. There are stenoses at the right MCA bifurcation, which could place the patient at risk of right MCA branch vessel infarction.  No antegrade flow in the left vertebral artery. Atherosclerotic narrowing of the PCA branches, left more than right  CNS imaging personally reviewed  Routine EEG 09/05/20 - personally reviewed and interpreted  This EEG was obtained while awake and asleep and is abnormal due to mild diffuse slowing with superimposed focal L temporo-parietal slowing, indicating global cerebral dysfunction as well as focal dysfunction in the site of prior ischemic damage.   There were occasional sharply contoured discharges in the left temporo-parietal region that did not disrupt the background, did not have a significant field, and were not definitively epileptiform. There were no electrographic seizures identified. If further characterization would be helpful clinically, consider prolonged EEG monitoring with video.   Exam:  Vitals:   09/08/20 1217 09/08/20 1520  BP: (!) 171/65 (!) 130/55  Pulse: (!) 56 (!) 54  Resp: 18 18  Temp:  98 F (36.7 C)  SpO2: 91% 95%   Gen: In bed, NAD Resp: normal WOB Abd: soft, nt  Mental Status: Patient is awake, alert, oriented x4, follows simple commands Speech: no dysarthria, no aphasia, able to name and  repeat Cranial Nerves: II: Visual Fields are full. Pupils are equal, round, and reactive to light.   III,IV, VI: EOMI without ptosis or diploplia.  No significant nystagmus V: Facial sensation is symmetric to eyelash brush VII: Facial movement is symmetric  VIII: hearing is intact to voice X: Uvula elevates symmetrically XI: Unable to assess secondary to patient's mental status /participation XII: tongue is midline without atrophy or fasciculations.  Motor: patient refuses formal strength exam bc she needs to have a bowel movement and RN is with another patient. move all extremities well at least anti-gravity  Sensory: SILT Deep Tendon Reflexes:2+ and symmetric in the biceps and patellae.  Plantars:Toes are mute on the right and upgoing on left Cerebellar, gait: deferred  Impression: 65 yo with hx childhood seizures, resolved and single adult seizure Nov 2021 in setting of acute stroke p/w multiple acute confusional episodes c/f focal seizure with impaired awareness. By yesterday evening her mental status had improved nearly to her recent baseline. She is now undergoing evaluation for ACS but she has no further inpatient neurologic needs.   Recommendations:  - Continue keppra 750mg  bid - Patient has had multiple seizures at this point and has abnormal brain MRI and EEG findings. I strongly recommended that she continue taking antiseizure medication indefinitely (forever) bc she will be at high risk of recurrent event if she goes off her seizure medications. She agreed. - No changes to current medication regimen for secondary stroke prevention (ASA 325mg  antiplatelet monotherapy per Dr. and Dr. her vascular surgeon + atorvastatin 40mg  daily) - Patient is unable to drive, operate  heavy machinery, perform activities at heights or participate in water activities for at least 6 mos after last seizure and also until release by outpatient physician. This should be reiterated to patient at  hospital discharge and also written in discharge instructions - OK to discharge from neuro standpoint - F/u with Dr. Janann August outpatient neurologist at [facility] as scheduled  Bing Neighbors, MD [facility] [phone]  If 7pm- 7am, please page neurology on call as listed in AMION.

## 2020-09-08 NOTE — TOC Transition Note (Addendum)
Transition of Care [facility]) - CM/SW Discharge Note   Patient Details  Name: Maria Norris MRN: [id number] Date of Birth: [birth year]  [facility] CM/SW Contact:  Gildardo Griffes, LCSW Phone Number: 09/08/2020, 12:03 PM   Clinical Narrative:     CSW spoke with patient's sister Maria Norris regarding home health services. Maria Norris reports that patient will be following up with her neurologist with [facility] and she will be setting patient up with [facility] PT through [facility]. CSW informed Maria Norris of recommended [employer], reports patient has this at home. Maria Norris identified no needs at this time, does report she will be picking patient up once RN calls to state she is ready. RN informed.   CSW spoke with Maria Norris with [employer], agreeable to set patient up with 2L of O2, will bring to room.Patient's sister Maria Norris informed as well.   RN informed to ensure oxygen in room before patient leaves.   Final next level of care: Home w Home Health Services Barriers to Discharge: No Barriers Identified   Patient Goals and CMS Choice   CMS Medicare.gov Compare Post Acute Care list provided to:: Patient Represenative (must comment) (sister Maria Norris) Choice offered to / list presented to : Patient,Adult Children  Discharge Placement                       Discharge Plan and Services                                     Social Determinants of Health (SDOH) Interventions     Readmission Risk Interventions No flowsheet data found.

## 2020-09-08 NOTE — Progress Notes (Signed)
Progress Note  Patient Name: Maria Norris Date of Encounter: 09/08/2020  Primary Cardiologist: Plans to f/u with [facility]  Subjective   No recurrence of previously reported CP.   On 2L [place] oxygen and states she does not normally use O2 at home.   She has not been up to ambulate around the room but states that she was told they had plans to do so later.  Inpatient Medications    Scheduled Meds: . amoxicillin-clavulanate  1 tablet Oral Q12H  . aspirin  325 mg Oral Daily  . atorvastatin  40 mg Oral Daily  . carvedilol  12.5 mg Oral BID WC  . cloNIDine  0.1 mg Oral BID  . ezetimibe  10 mg Oral Daily  . gabapentin  800 mg Oral TID  . insulin aspart  0-5 Units Subcutaneous QHS  . insulin aspart  0-9 Units Subcutaneous TID WC  . insulin glargine  12 Units Subcutaneous QHS  . isosorbide mononitrate  30 mg Oral Daily  . isosorbide-hydrALAZINE  1 tablet Oral TID  . losartan  100 mg Oral Daily  . magnesium oxide  400 mg Oral Daily  . multivitamin with minerals  1 tablet Oral Daily  . NIFEdipine  60 mg Oral Daily  . pantoprazole  40 mg Oral Daily  . vitamin B-12  100 mcg Oral Daily   Continuous Infusions: . sodium chloride Stopped (09/06/20 2030)  . levETIRAcetam 750 mg (09/07/20 2109)  . promethazine (PHENERGAN) injection (IM or IVPB) Stopped (09/05/20 0908)   PRN Meds: sodium chloride, acetaminophen, acetaminophen, albuterol, labetalol, LORazepam, meclizine, nitroGLYCERIN, ondansetron (ZOFRAN) IV, promethazine (PHENERGAN) injection (IM or IVPB)   Vital Signs    Vitals:   09/07/20 1152 09/07/20 1548 09/07/20 2057 09/08/20 0450  BP: (!) 175/40 (!) 123/39 (!) 199/76 (!) 183/62  Pulse: 92 (!) 108 67 66  Resp: 16 17 19 18   Temp: 98.1 F (36.7 C) 98.4 F (36.9 C) 98.9 F (37.2 C) 98.5 F (36.9 C)  TempSrc: Oral Oral  Oral  SpO2: 92% 93% 94% 95%  Weight:      Height:        Intake/Output Summary (Last 24 hours) at 09/08/2020 0842 Last data filed at 09/08/2020 0400 Gross  per 24 hour  Intake 1162.07 ml  Output 2250 ml  Net -1087.93 ml   Last 3 Weights 09/05/2020 09/04/2020  Weight (lbs) 156 lb 1.6 oz 174 lb 8 oz  Weight (kg) 70.806 kg 79.153 kg      Telemetry    NSR - SB, 50-60s- Personally Reviewed  ECG    No new tracings- Personally Reviewed  Physical Exam   GEN: No acute distress.  Watching TV Neck: No JVD, left-sided CVA scar.  Faint bilateral carotid bruits. Cardiac:  Bradycardic ,1/6 RUSB systolic murmur, rubs, or gallops.  Not tender to palpation. Respiratory:  Scattered rhonchi appreciated throughout auscultation GI: Soft, nontender, non-distended  MS: No edema; No deformity. Neuro:  Nonfocal  Psych: Normal affect   Labs    High Sensitivity Troponin:   Recent Labs  Lab 09/04/20 1706 09/04/20 1947 09/04/20 2328 09/07/20 1114 09/07/20 1247  TROPONINIHS 8 9 9 13 15       Chemistry Recent Labs  Lab 09/04/20 1453 09/05/20 0721 09/06/20 0500 09/07/20 0540 09/08/20 0421  NA 139   < > 136 137 133*  K 4.3   < > 4.6 4.7 4.8  CL 102   < > 94* 99 93*  CO2 29   < >  31 31 31   GLUCOSE 193*   < > 162* 149* 177*  BUN 27*   < > 36* 24* 24*  CREATININE 0.69   < > 0.97 0.64 0.71  CALCIUM 9.7   < > 9.7 9.4 9.1  PROT 7.5  --   --   --   --   ALBUMIN 3.9  --   --   --   --   AST 23  --   --   --   --   ALT 27  --   --   --   --   ALKPHOS 38  --   --   --   --   BILITOT 0.7  --   --   --   --   GFRNONAA >60   < > >60 >60 >60  ANIONGAP 8   < > 11 7 9    < > = values in this interval not displayed.     Hematology Recent Labs  Lab 09/06/20 0500 09/07/20 0540 09/08/20 0421  WBC 10.9* 7.9 9.1  RBC 4.21 3.89 3.85*  HGB 13.7 12.8 12.7  HCT 40.5 36.8 35.9*  MCV 96.2 94.6 93.2  MCH 32.5 32.9 33.0  MCHC 33.8 34.8 35.4  RDW 12.9 11.9 11.7  PLT 281 233 231    BNP Recent Labs  Lab 09/05/20 1806  BNP 280.4*     DDimer  Recent Labs  Lab 09/07/20 1247  DDIMER 0.30     Radiology    DG Chest Port 1 View  Result Date:  09/07/2020 CLINICAL DATA:  Fever EXAM: PORTABLE CHEST 1 VIEW COMPARISON:  Sep 05, 2020 FINDINGS: Lungs are clear. There is cardiomegaly with pulmonary vascularity normal. No adenopathy. There is aortic atherosclerosis. No bone lesions. IMPRESSION: Cardiomegaly.  Lungs clear. Aortic Atherosclerosis (ICD10-I70.0). Electronically Signed   By: 11/07/2020 III M.D.   On: 09/07/2020 09:49    Cardiac Studies   Echo 09/05/20 1. Left ventricular ejection fraction, by estimation, is 55 to 60%. The  left ventricle has normal function. The left ventricle has no regional  wall motion abnormalities. There is moderate left ventricular hypertrophy.  Left ventricular diastolic  parameters are consistent with Grade I diastolic dysfunction (impaired  relaxation).  2. Right ventricular systolic function is normal. The right ventricular  size is normal.  3. Left atrial size was mildly dilated.  4. The aortic valve was not well visualized. Aortic valve regurgitation  is not visualized. Mild aortic valve stenosis. Aortic valve area, by VTI  measures 1.52 cm. Aortic valve mean gradient measures 10.0 mmHg.   Patient Profile     65 y.o. female with a history of CAD, HTN, HL, DMII, atypical chest pain, fibromyalgia, hypertropic cardiomyopathy, HFpEF, carotid dzs s/p L CEA, stroke, PE (05/2017), anxiety, depression, tob abuse, and COPD, and seen today for chest pain.  Assessment & Plan    Chest pain, atypical, suspect MSK etiology Coronary artery disease -- No current chest pain.  Previous CP resolved with IV morphine. --Long history of chest pain/atypical CP, dating back to 1998.   -- 09/2017 cath with 25% diffuse RCA stenosis. -- Previous PVCs resolved on telemetry. -- HS Tn 13  15. -- 5/19 EF 55-60% -- No plan for further ischemic work-up this admission. --No indication for IV heparin -- Suspect non-cardiac etiology of CP, likely MSK etiolog given TTP and resolution with IV morphone. Also considered  is HTN as contributing as below, as it has been not well  controlled. -- Patient plans to follow-up with [facility] cardiology. --Continue ASA, Coreg, Lipitor, Zetia, nitrates. -- Aggressive secondary prevention. --Recommend ambulate before discharge and attempt to wean off O2 as tolerated, given she has indicated that she does not use at home. --Given poorly controlled HTN as below, and per rounding MD, consider follow-up with [facility] to bridge through until follow-up with [facility], given likely wait to get into [facility].   Hypertension --Not well controlled. Pressures elevated most of this admission. --Initial SBP 180s, improving to 150s --> may improve with AM medications. --Beta-blocker switched from Lopressor to Coreg with BP still uncontrolled.   --She is on multiple antihypertensives as below:  Coreg 12.5mg  BID  Clonidine 0.1mg  BID  Imdur 30 mg qd  PRN Labetalol 20mg  q3h  Losartan 100mg  qd  Nifedipine 60 mg qd -- Continue PTA antihypertensives and titrate as needed for optimized BP and HTN control in the setting of HCM/LVH. Discussed with MD with plan to possibly increase BiDil or go up on clonidine before discharge to ensure well controlled. Per IM, plans to order 24h urine and assess metanephrines to rule out pheochromocytoma. Also, could consider RAS workup at RTC.  PVCs, NSVT --No recurrence on telemetry.  --Continue to monitor electrolytes. --Continue BB.  History of HCM -- No LVOT obstruction noted on echo, no asymmetric hypertrophy. EF nl. Discussed with MD and no asymmetric hypertrophy on echo, which is reassuring. -- Euvolemic on exam.  -- Outpatient follow-up recommended.  -- She plans to follow-up with [facility] cardiology. -- Continue beta-blockers, CCM. -- HR and BP control recommended.  AS, mild -- Aortic stenosis mild by most recent echo as above.   --Continue to monitor with periodic echo. --HR, BP control.   AMS -- Resolved.  Continue per IM, neurology.  Seizures,  H/o CVA --On Keprra.  --BP control recommended. --Continue mgmt per neuro.  HLD, HTG --Continue statin  DM2 --SSI, Per IM.  Carotid dz --S/p L sided CEA.  --B/l carotid bruits on exam --F/u imaging at [facility]  History of Tobacco use / COPD --Quit smoking 2w prior.  --Ongoing cessation recommended.   For questions or updates, please contact [facility] Please consult [email or web address] for contact info under        Signed, 03-08-1990, PA-C  09/08/2020, 8:42 AM

## 2020-09-08 NOTE — Discharge Instructions (Signed)
No Driving  Wear oxygen 82/4. Pulse ox dropped To 85% with ambulation

## 2020-09-08 NOTE — Progress Notes (Signed)
Occupational Therapy Treatment Patient Details Name: Maria Norris MRN: [id number] DOB: 09-Oct-1955 Today's Date: 09/08/2020    History of present illness 65 y.o. female with medical history significant of stroke, hypertension, hyperlipidemia, diabetes mellitus, stroke, childhood seizure (did not have seizure since age of 18), CAD, former smoker (quit smoking 1 week ago), bilateral carotid artery stenosis (s/p of right carotid endarterectomy 12/2019), chronic back pain, who presents with altered mental status/weakness/tremors.   OT comments  Pt seen for OT tx this date. Pt instructed in ECS to incorporate during sponge bath while standing at sink with setup. Pt required 2 seated rest breaks. SpO2 on room air down to 89% but bounced back to 95% with seated rest break and use of pursed lip breathing. Pt educated in compensatory strategies to support ADL/IADL participation at home in order to minimize falls risk and SOB/over exertion. PT verbalized understanding. D/c recommendation updated to HHOT. Care team notified.    Follow Up Recommendations  Home health OT    Equipment Recommendations  None recommended by OT    Recommendations for Other Services      Precautions / Restrictions Precautions Precautions: Fall Restrictions Weight Bearing Restrictions: No       Mobility Bed Mobility Overal bed mobility: Modified Independent                  Transfers Overall transfer level: Needs assistance Equipment used: None Transfers: Sit to/from Stand Sit to Stand: Min guard;Supervision              Balance Overall balance assessment: Needs assistance Sitting-balance support: No upper extremity supported;Feet supported Sitting balance-Leahy Scale: Good     Standing balance support: No upper extremity supported;During functional activity Standing balance-Leahy Scale: Fair Standing balance comment: intermittent UE support on counter                           ADL  either performed or assessed with clinical judgement   ADL Overall ADL's : Needs assistance/impaired         Upper Body Bathing: Supervision/ safety;Set up;Cueing for compensatory techniques   Lower Body Bathing: Sitting/lateral leans;Sit to/from stand;Supervison/ safety;Set up;Cueing for compensatory techniques                               Vision Patient Visual Report: No change from baseline     Perception     Praxis      Cognition Arousal/Alertness: Awake/alert Behavior During Therapy: WFL for tasks assessed/performed Overall Cognitive Status: Within Functional Limits for tasks assessed                                          Exercises Other Exercises Other Exercises: Pt instructed in ECS to incorporate during sponge bath while standing at sink with setup. Pt required 2 seated rest breaks. SpO2 on room air down to 89% but bounced back to >95% with seated rest break and use of pursed lip breathing.   Shoulder Instructions       General Comments      Pertinent Vitals/ Pain       Pain Assessment: No/denies pain  Home Living  Prior Functioning/Environment              Frequency  Min 2X/week        Progress Toward Goals  OT Goals(current goals can now be found in the care plan section)  Progress towards OT goals: Progressing toward goals  Acute Rehab OT Goals Patient Stated Goal: get back home OT Goal Formulation: With patient Time For Goal Achievement: 09/20/20 Potential to Achieve Goals: Good  Plan Frequency remains appropriate;Discharge plan needs to be updated    Co-evaluation                 AM-PAC OT "6 Clicks" Daily Activity     Outcome Measure   Help from another person eating meals?: None Help from another person taking care of personal grooming?: None Help from another person toileting, which includes using toliet, bedpan, or urinal?: A  Little Help from another person bathing (including washing, rinsing, drying)?: A Little Help from another person to put on and taking off regular upper body clothing?: None Help from another person to put on and taking off regular lower body clothing?: A Little 6 Click Score: 21    End of Session    OT Visit Diagnosis: Muscle weakness (generalized) (M62.81);Unsteadiness on feet (R26.81)   Activity Tolerance Patient tolerated treatment well   Patient Left in bed;with call bell/phone within reach;with bed alarm set   Nurse Communication          Time: 3335-4562 OT Time Calculation (min): 24 min  Charges: OT General Charges $OT Visit: 1 Visit OT Treatments $Self Care/Home Management : 23-37 mins  Wynona Canes, MPH, MS, OTR/L ascom [phone] 09/08/20, 12:22 PM

## 2020-09-08 NOTE — TOC Progression Note (Signed)
Transition of Care [facility]) - Progression Note    Patient Details  Name: Maria Norris MRN: [id number] Date of Birth: 1956/02/09  Transition of Care [facility]) CM/SW Contact  Gildardo Griffes, [place] Phone Number: 09/08/2020, 2:00 PM  Clinical Narrative:      Per Bjorn Loser with [employer] patient declined Oxygen that was recommended at discharge.   Barriers to Discharge: No Barriers Identified  Expected Discharge Plan and Services           Expected Discharge Date: 09/08/20                                     Social Determinants of Health (SDOH) Interventions    Readmission Risk Interventions No flowsheet data found.

## 2020-09-08 NOTE — Discharge Summary (Signed)
[facility] [place] NAME: Maria Norris    MR#:  [id number]  DATE OF BIRTH:  11-Aug-1955  DATE OF ADMISSION:  09/04/2020 ADMITTING PHYSICIAN: Ivor Costa, MD  DATE OF DISCHARGE: 09/08/2020  PRIMARY CARE PHYSICIAN: Pcp, No    ADMISSION DIAGNOSIS:  Dyspnea [R06.00] Acute encephalopathy [G93.40] Altered mental status, unspecified altered mental status type [M19.62] Acute metabolic encephalopathy [I29.79]  DISCHARGE DIAGNOSIS:  Principal Problem:   Acute metabolic encephalopathy Active Problems:   Coronary artery disease   Diabetes mellitus without complication ([place])   Hypertension   Seizures ([place])   Stroke ([place])   Hypertensive urgency   Chest pain   Hypertriglyceridemia   Acute encephalopathy   Vertigo   Type 2 diabetes mellitus with hyperlipidemia ([place])   SECONDARY DIAGNOSIS:   Past Medical History:  Diagnosis Date  . (HFpEF) heart failure with preserved ejection fraction ([place])    a. 03/2020 Echo: EF >65%, diast dysfxn; b. 08/2020 Echo: EF 55-60%, no rwma, gr1 DD, mod LVH. Nl RV size/fxn. Mild LAE, Mild AS.  Marland Kitchen Anxiety   . Carotid arterial disease ([place])    a. 02/2020 s/p L CEA; b. 02/2020 CTA: bilat plaque w/o hemodynamically significant dzs; c. 07/2020 U/S: RICA/RCCA/RECA <50%. L ICA nl, LCCA/ECA <50%. L vert near-occlusion/occlusion.  Marland Kitchen COPD (chronic obstructive pulmonary disease) ([place])   . Coronary artery disease    a. 1990 Cath: LAD 20-40. OM 100 w/ L->L collats; b. 04/2004 Cath: LCX 25; c. 03/2009 Cath: min irregs in dLCX (<20%). EF 65%; d. 09/2017 Cath: Lateral OM2 100 (small, old), RCA 25p/m/d.  Marland Kitchen Depression   . Diabetes mellitus without complication ([place])   . Fibromyalgia   . HLD (hyperlipidemia)   . Hypertension   . Hypertrophic cardiomyopathy ([place])   . Insomnia   . Lung nodule   . Mild Aortic stenosis    a. 08/2020 Echo: Mild AS. Nl EF.  . Morbid obesity ([place])   . Pulmonary embolism ([place])    a. 05/2017 - treated w/  Eliquis.  Marland Kitchen Restless legs syndrome   . Seizures ([place])   . Stroke ([place])    a. 02/2020 L pontine and L MCA strokes. TEE neg for clot.  . Thoracic spondylosis   . Tobacco abuse     HOSPITAL COURSE:   1.  Acute metabolic encephalopathy.  Patient had an episode of acute mental status change as outpatient and she was confused not knowing where she was and had decreased responsiveness.  Mental status worsened during the hospital course when the patient was given some IV Dilaudid.  Mental status improved during the hospital course.  Patient was seen by neurology, MRI of the brain was negative for acute stroke.  MRI of the brain did show an old infarct in left pons.  MR a of the brain did show stenosis right MCA bifurcation and narrowing of the PCA branches left more than the right.  Gabapentin dose decreased. 2.  Acute chest pain yesterday morning.  Patient already on aspirin beta-blocker and atorvastatin we started heparin drip cardiac enzymes x2 were negative D-dimer was also negative. [facility] cardiology consultation.  EKG did show some lateral ST depression initially.  I do not believe that this was an acute cardiac event since cardiac enzymes were negative. 3.  Acute vertigo yesterday.  As needed meclizine.  Tympanic membrane on the left was erythematous and I did start Augmentin and will finish up a course. 4.  Hypertensive urgency on numerous  medications.  Blood pressure variable during the hospital stay.  I did send off a 24-hour urine for new metanephrines and serum metanephrines to have a screening test for pheochromocytoma.  Consider further screening for secondary causes of hypertension as outpatient.  Patient will follow up with cardiology as outpatient and potentially have a renal artery sonogram. 5.  Type 2 diabetes mellitus with hyperlipidemia.  Can go back on metformin as outpatient.  Continue atorvastatin.  Hemoglobin A1c 7.7. 6.  History of CAD and hypertriglyceridemia.  On Lipitor  already.  Triglycerides elevated at 677.  Can consider adding Tricor as outpatient. 7.  Seizure disorder.  Seen by neurology and they increase the Keppra dose to 750 mg twice a day.  Dr. Quinn Axe neurology did speak with the patient's neurologist Dr. Mariel Kansky and they will follow her up as outpatient. 8.  Constipation patient did request an enema prior to disposition 9.  Acute hypoxic respiratory failure.  The patient has a history of COPD.  No signs of exacerbation.  The patient was on oxygen during the entire hospital stay.  I took her off oxygen this morning and when the patient ambulated her pulse ox went down to 85.  She qualifies for home oxygen.  This can also contribute to altered mental status if she was hypoxic as outpatient. 10.  Chronic diastolic congestive heart failure.  No signs of heart failure on this hospital stay.  DISCHARGE CONDITIONS:   Satisfactory  CONSULTS OBTAINED:  Treatment Team:  Kate Sable, MD  DRUG ALLERGIES:   Allergies  Allergen Reactions  . Erythromycin Other (See Comments) and Rash    Unknown  Unknown    . Codeine   . Paroxetine   . Sertraline Other (See Comments)    Suicidal thoughts Suicidal thoughts   . Wellbutrin [Bupropion]   . Buspirone Rash    DISCHARGE MEDICATIONS:   Allergies as of 09/08/2020      Reactions   Erythromycin Other (See Comments), Rash   Unknown  Unknown    Codeine    Paroxetine    Sertraline Other (See Comments)   Suicidal thoughts Suicidal thoughts   Wellbutrin [bupropion]    Buspirone Rash      Medication List    STOP taking these medications   EQ Nicotine 14 mg/24hr patch Generic drug: nicotine   gabapentin 600 MG tablet Commonly known as: NEURONTIN Replaced by: gabapentin 400 MG capsule   isosorbide dinitrate 30 MG tablet Commonly known as: ISORDIL   L-Theanine 100 MG Caps   magnesium oxide 400 (240 Mg) MG tablet Commonly known as: MAG-OX   metoprolol tartrate 25 MG tablet Commonly known  as: LOPRESSOR   naloxone 4 MG/0.1ML Liqd nasal spray kit Commonly known as: NARCAN     TAKE these medications   albuterol (2.5 MG/3ML) 0.083% nebulizer solution Commonly known as: PROVENTIL Inhale 2.5 mLs into the lungs every 6 (six) hours as needed.   albuterol 108 (90 Base) MCG/ACT inhaler Commonly known as: VENTOLIN HFA Inhale 1-2 puffs into the lungs every 6 (six) hours as needed.   amoxicillin-clavulanate 875-125 MG tablet Commonly known as: AUGMENTIN Take 1 tablet by mouth every 12 (twelve) hours.   aspirin 325 MG EC tablet Take 325 mg by mouth daily. What changed: Another medication with the same name was removed. Continue taking this medication, and follow the directions you see here.   atorvastatin 40 MG tablet Commonly known as: LIPITOR Take 40 mg by mouth daily.   carvedilol 12.5 MG tablet  Commonly known as: COREG Take 1 tablet (12.5 mg total) by mouth 2 (two) times daily with a meal.   cloNIDine 0.1 MG tablet Commonly known as: CATAPRES Take 0.1 mg by mouth in the morning and at bedtime.   gabapentin 400 MG capsule Commonly known as: NEURONTIN Take 2 capsules (800 mg total) by mouth 3 (three) times daily. Replaces: gabapentin 600 MG tablet   insulin glargine 100 UNIT/ML injection Commonly known as: LANTUS Inject 12 Units into the skin at bedtime.   isosorbide-hydrALAZINE 20-37.5 MG tablet Commonly known as: BIDIL Take 1 tablet by mouth 3 (three) times daily.   levETIRAcetam 750 MG tablet Commonly known as: Keppra Take 1 tablet (750 mg total) by mouth 2 (two) times daily. What changed:  medication strength how much to take when to take this additional instructions   losartan 100 MG tablet Commonly known as: COZAAR Take 100 mg by mouth daily.   magnesium oxide 400 MG tablet Commonly known as: MAG-OX Take 400 mg by mouth daily.   meclizine 12.5 MG tablet Commonly known as: ANTIVERT Take 1 tablet (12.5 mg total) by mouth 3 (three) times daily  as needed for dizziness.   metFORMIN 1000 MG tablet Commonly known as: GLUCOPHAGE Take 1,000 mg by mouth 2 (two) times daily with a meal.   Multi-Vitamin tablet Take 1 tablet by mouth daily.   NIFEdipine 60 MG 24 hr tablet Commonly known as: ADALAT CC Take 60 mg by mouth daily.   nitroGLYCERIN 0.4 MG SL tablet Commonly known as: NITROSTAT Place 0.4 mg under the tongue every 5 (five) minutes as needed.   pantoprazole 40 MG tablet Commonly known as: PROTONIX Take 40 mg by mouth daily.   vitamin B-12 100 MCG tablet Commonly known as: CYANOCOBALAMIN Take 100 mcg by mouth daily.            Durable Medical Equipment  (From admission, onward)         Start     Ordered   09/08/20 1224  For home use only DME oxygen  Once       Question Answer Comment  Length of Need Lifetime   Mode or (Route) Nasal cannula   Liters per Minute 2   Frequency Continuous (stationary and portable oxygen unit needed)   Oxygen conserving device Yes   Oxygen delivery system Gas      09/08/20 1223   09/08/20 1156  For home use only DME Walker rolling  Once       Question Answer Comment  Walker: With 5 Inch Wheels   Patient needs a walker to treat with the following condition Unsteady gait when walking      09/08/20 1156           DISCHARGE INSTRUCTIONS:   Follow-up your medical doctor 5 days Follow-up your neurologist Follow-up cardiology 1 to 2 weeks  If you experience worsening of your admission symptoms, develop shortness of breath, life threatening emergency, suicidal or homicidal thoughts you must seek medical attention immediately by calling 911 or calling your MD immediately  if symptoms less severe.  You Must read complete instructions/literature along with all the possible adverse reactions/side effects for all the Medicines you take and that have been prescribed to you. Take any new Medicines after you have completely understood and accept all the possible adverse  reactions/side effects.   Please note  You were cared for by a hospitalist during your hospital stay. If you have any questions about your discharge medications  or the care you received while you were in the hospital after you are discharged, you can call the unit and asked to speak with the hospitalist on call if the hospitalist that took care of you is not available. Once you are discharged, your primary care physician will handle any further medical issues. Please note that NO REFILLS for any discharge medications will be authorized once you are discharged, as it is imperative that you return to your primary care physician (or establish a relationship with a primary care physician if you do not have one) for your aftercare needs so that they can reassess your need for medications and monitor your lab values.    Today   CHIEF COMPLAINT:   Chief Complaint  Patient presents with  . Code Stroke    HISTORY OF PRESENT ILLNESS:  Maria Norris  is a 65 y.o. female was brought in with acute altered mental status   VITAL SIGNS:  Blood pressure (!) 130/55, pulse (!) 54, temperature 98 F (36.7 C), resp. rate 18, height _0  (1.575 m), weight 70.8 kg, SpO2 95 %.  I/O:    Intake/Output Summary (Last 24 hours) at 09/08/2020 1728 Last data filed at 09/08/2020 0940 Gross per 24 hour  Intake 840 ml  Output 1350 ml  Net -510 ml    PHYSICAL EXAMINATION:  GENERAL:  65 y.o.-year-old patient lying in the bed with no acute distress.  EYES: Pupils equal, round, reactive to light and accommodation. No scleral icterus. HEENT: Head atraumatic, normocephalic. Oropharynx and nasopharynx clear.  LUNGS: Normal breath sounds bilaterally, no wheezing, rales,rhonchi or crepitation. No use of accessory muscles of respiration.  CARDIOVASCULAR: S1, S2 normal. No murmurs, rubs, or gallops.  ABDOMEN: Soft, non-tender, non-distended.  EXTREMITIES: No pedal edema.  NEUROLOGIC: Cranial nerves II through XII are  intact. Muscle strength 5/5 in all extremities. Sensation intact. Gait not checked.  PSYCHIATRIC: The patient is alert and oriented x 3.  SKIN: No obvious rash, lesion, or ulcer.   DATA REVIEW:   CBC Recent Labs  Lab 09/08/20 0421  WBC 9.1  HGB 12.7  HCT 35.9*  PLT 231    Chemistries  Recent Labs  Lab 09/04/20 1453 09/05/20 0721 09/07/20 1114 09/08/20 0421  NA 139   < >  --  133*  K 4.3   < >  --  4.8  CL 102   < >  --  93*  CO2 29   < >  --  31  GLUCOSE 193*   < >  --  177*  BUN 27*   < >  --  24*  CREATININE 0.69   < >  --  0.71  CALCIUM 9.7   < >  --  9.1  MG  --   --  1.8  --   AST 23  --   --   --   ALT 27  --   --   --   ALKPHOS 38  --   --   --   BILITOT 0.7  --   --   --    < > = values in this interval not displayed.    Cardiac Enzymes No results for input(s): TROPONINI in the last 168 hours.  Microbiology Results  Results for orders placed or performed during the hospital encounter of 09/04/20  Resp Panel by RT-PCR (Flu A&B, Covid) Nasopharyngeal Swab     Status: None   Collection Time: 09/04/20  2:53 PM  Specimen: Nasopharyngeal Swab; Nasopharyngeal(NP) swabs in vial transport medium  Result Value Ref Range Status   SARS Coronavirus 2 by RT PCR NEGATIVE NEGATIVE Final    Comment: (NOTE) SARS-CoV-2 target nucleic acids are NOT DETECTED.  The SARS-CoV-2 RNA is generally detectable in upper respiratory specimens during the acute phase of infection. The lowest concentration of SARS-CoV-2 viral copies this assay can detect is 138 copies/mL. A negative result does not preclude SARS-Cov-2 infection and should not be used as the sole basis for treatment or other patient management decisions. A negative result may occur with  improper specimen collection/handling, submission of specimen other than nasopharyngeal swab, presence of viral mutation(s) within the areas targeted by this assay, and inadequate number of viral copies(<138 copies/mL). A negative  result must be combined with clinical observations, patient history, and epidemiological information. The expected result is Negative.  Fact Sheet for Patients:  EntrepreneurPulse.com.au  Fact Sheet for Healthcare Providers:  IncredibleEmployment.be  This test is no t yet approved or cleared by the [place] FDA and  has been authorized for detection and/or diagnosis of SARS-CoV-2 by FDA under an Emergency Use Authorization (EUA). This EUA will remain  in effect (meaning this test can be used) for the duration of the COVID-19 declaration under Section 564(b)(1) of the Act, 21 U.S.C.section 360bbb-3(b)(1), unless the authorization is terminated  or revoked sooner.       Influenza A by PCR NEGATIVE NEGATIVE Final   Influenza B by PCR NEGATIVE NEGATIVE Final    Comment: (NOTE) The Xpert Xpress SARS-CoV-2/FLU/RSV plus assay is intended as an aid in the diagnosis of influenza from Nasopharyngeal swab specimens and should not be used as a sole basis for treatment. Nasal washings and aspirates are unacceptable for Xpert Xpress SARS-CoV-2/FLU/RSV testing.  Fact Sheet for Patients: EntrepreneurPulse.com.au  Fact Sheet for Healthcare Providers: IncredibleEmployment.be  This test is not yet approved or cleared by the [place] FDA and has been authorized for detection and/or diagnosis of SARS-CoV-2 by FDA under an Emergency Use Authorization (EUA). This EUA will remain in effect (meaning this test can be used) for the duration of the COVID-19 declaration under Section 564(b)(1) of the Act, 21 U.S.C. section 360bbb-3(b)(1), unless the authorization is terminated or revoked.  Performed at [facility], [address], [place] [address]     RADIOLOGY:  [facility] Chest Port 1 View  Result Date: 09/07/2020 CLINICAL DATA:  Fever EXAM: PORTABLE CHEST 1 VIEW COMPARISON:  Sep 05, 2020 FINDINGS:  Lungs are clear. There is cardiomegaly with pulmonary vascularity normal. No adenopathy. There is aortic atherosclerosis. No bone lesions. IMPRESSION: Cardiomegaly.  Lungs clear. Aortic Atherosclerosis (ICD10-I70.0). Electronically Signed   By: Lowella Grip III M.D.   On: 09/07/2020 09:49    EKG:   Orders placed or performed during the hospital encounter of 09/04/20  . ED EKG  . ED EKG  . EKG 12-Lead  . EKG 12-Lead  . EKG 12-Lead  . EKG 12-Lead      Management plans discussed with the patient, family and they are in agreement.  CODE STATUS:     Code Status Orders  (From admission, onward)         Start     Ordered   09/05/20 0347  Full code  Continuous        09/05/20 0346        Code Status History    This patient has a current code status but no historical code status.  Advance Care Planning Activity      TOTAL TIME TAKING CARE OF THIS PATIENT: 35 minutes.    Loletha Grayer M.D on 09/08/2020 at 5:28 PM  Between 7am to 6pm - Pager - [phone]  After 6pm go to [email or web address] - password [email or web address]  [facility]  CC: Primary care physician; Pcp, No

## 2020-09-08 NOTE — Progress Notes (Signed)
O2 Qualification Note  Patient Details Name: Maria Norris MRN: [id number] DOB: [birth year]   SpO2 on room air at rest = 85% SpO2 on room air while ambulating = 85% SpO2 on 2 liters of O2 while ambulating = 94%  Pt desaturates on room air with mobility and requires supplemental oxygen to maintain a safe saturation level.   12:04 PM, 09/08/20 [name], PT, DPT Physical Therapist - [facility]  [phone] (ASCOM)   [name] C 09/08/2020, 12:03 PM

## 2020-09-13 LAB — METANEPHRINES, URINE, 24 HOUR
Metaneph Total, Ur: 30 ug/L
Metanephrines, 24H Ur: 69 ug/24 hr (ref 36–209)
Normetanephrine, 24H Ur: 239 ug/24 hr (ref 131–612)
Normetanephrine, Ur: 104 ug/L
Total Volume: 2300

## 2020-09-13 LAB — VITAMIN B1: Vitamin B1 (Thiamine): 202.7 nmol/L — ABNORMAL HIGH (ref 66.5–200.0)

## 2020-09-14 LAB — METANEPHRINES, PLASMA
Metanephrine, Free: 15.7 pg/mL (ref 0.0–88.0)
Normetanephrine, Free: 49.9 pg/mL (ref 0.0–285.2)

## 2020-09-16 ENCOUNTER — Ambulatory Visit: Payer: Medicare Other | Admitting: Physician Assistant

## 2022-05-09 ENCOUNTER — Ambulatory Visit
Admission: EM | Admit: 2022-05-09 | Discharge: 2022-05-09 | Disposition: A | Discharge status: Home / Self Care | Payer: Medicare Other

## 2022-05-09 ENCOUNTER — Encounter: Payer: Self-pay | Admitting: Emergency Medicine

## 2022-05-09 ENCOUNTER — Ambulatory Visit (INDEPENDENT_AMBULATORY_CARE_PROVIDER_SITE_OTHER): Payer: Medicare Other

## 2022-05-09 DIAGNOSIS — R051 Acute cough: Secondary | ICD-10-CM

## 2022-05-09 DIAGNOSIS — J029 Acute pharyngitis, unspecified: Secondary | ICD-10-CM

## 2022-05-09 DIAGNOSIS — J019 Acute sinusitis, unspecified: Secondary | ICD-10-CM

## 2022-05-09 MED ORDER — LIDOCAINE VISCOUS HCL 2 % MT SOLN: 15.0000 mL | OROMUCOSAL | 0 refills | Status: DC | PRN

## 2022-05-09 MED ORDER — AMOXICILLIN-POT CLAVULANATE 875-125 MG PO TABS: 1.0000 | ORAL_TABLET | Freq: Two times a day (BID) | ORAL | 0 refills | Status: AC

## 2022-05-09 MED ORDER — PROMETHAZINE-DM 6.25-15 MG/5ML PO SYRP: 5.0000 mL | ORAL_SOLUTION | Freq: Four times a day (QID) | ORAL | 0 refills | Status: DC | PRN

## 2022-05-09 NOTE — ED Triage Notes (Signed)
Pt c/o cough, fever, sore throat. Started about 3 weeks ago. Daughter  states flu has been going around their family.

## 2022-05-09 NOTE — ED Provider Notes (Signed)
[facility]    CSN: [id number] Arrival date & time: 05/09/22  1438      History   Chief Complaint Chief Complaint  Patient presents with   Cough    HPI Maria Norris is a 67 y.o. female.   Today patient is presenting for 3-week history of productive cough, fatigue, sore throat, sinus pain and pressure.  Symptoms have not gotten any better or worse from onset.  She states that she had a fever for about a week and a half but it broke a week and a half ago.  She denies chest pain or shortness of breath, vomiting or diarrhea.  She has been exposed to influenza throughout.  That she has been ill.  She has been taking over-the-counter cough medicine.  Patient's medical history significant for heart disease, COPD, diabetes, hypertension, obesity.  HPI  Past Medical History:  Diagnosis Date   (HFpEF) heart failure with preserved ejection fraction (HCC)    a. 03/2020 Echo: EF >65%, diast dysfxn; b. 08/2020 Echo: EF 55-60%, no rwma, gr1 DD, mod LVH. Nl RV size/fxn. Mild LAE, Mild AS.   Anxiety    Carotid arterial disease (HCC)    a. 02/2020 s/p L CEA; b. 02/2020 CTA: bilat plaque w/o hemodynamically significant dzs; c. 07/2020 U/S: RICA/RCCA/RECA <50%. L ICA nl, LCCA/ECA <50%. L vert near-occlusion/occlusion.   COPD (chronic obstructive pulmonary disease) (HCC)    Coronary artery disease    a. 1990 Cath: LAD 20-40. OM 100 w/ L->L collats; b. 04/2004 Cath: LCX 25; c. 03/2009 Cath: min irregs in dLCX (<20%). EF 65%; d. 09/2017 Cath: Lateral OM2 100 (small, old), RCA 25p/m/d.   Depression    Diabetes mellitus without complication (HCC)    Fibromyalgia    HLD (hyperlipidemia)    Hypertension    Hypertrophic cardiomyopathy (HCC)    Insomnia    Lung nodule    Mild Aortic stenosis    a. 08/2020 Echo: Mild AS. Nl EF.   Morbid obesity (HCC)    Pulmonary embolism (HCC)    a. 05/2017 - treated w/ Eliquis.   Restless legs syndrome    Seizures (HCC)    Stroke (HCC)    a. 02/2020 L  pontine and L MCA strokes. TEE neg for clot.   Thoracic spondylosis    Tobacco abuse     Patient Active Problem List   Diagnosis Date Noted   Vertigo    Type 2 diabetes mellitus with hyperlipidemia (HCC)    Acute encephalopathy 09/05/2020   Acute metabolic encephalopathy 09/04/2020   Stroke (HCC) 09/04/2020   Chest pain 09/04/2020   Coronary artery disease    Diabetes mellitus without complication (HCC)    Hypertension    Seizures (HCC)    Hypertensive urgency    Hypertriglyceridemia     Past Surgical History:  Procedure Laterality Date   BACK SURGERY     CHOLECYSTECTOMY     TUBAL LIGATION      OB History   No obstetric history on file.      Home Medications    Prior to Admission medications   Medication Sig Start Date End Date Taking? Authorizing Provider  albuterol (PROVENTIL) (2.5 MG/3ML) 0.083% nebulizer solution Inhale 2.5 mLs into the lungs every 6 (six) hours as needed. 12/18/18  Yes [provider]  amoxicillin-clavulanate (AUGMENTIN) 875-125 MG tablet Take 1 tablet by mouth every 12 (twelve) hours for 7 days. 05/09/22 05/16/22 Yes Eusebio Friendly B, PA-C  carvedilol (COREG) 12.5 MG tablet  Take 1 tablet (12.5 mg total) by mouth 2 (two) times daily with a meal. 09/08/20  Yes Wieting, Richard, MD  cloNIDine (CATAPRES) 0.1 MG tablet Take 0.1 mg by mouth in the morning and at bedtime. 03/17/20 05/09/22 Yes [provider]  insulin glargine (LANTUS) 100 UNIT/ML injection Inject 12 Units into the skin at bedtime.   Yes [provider]  lidocaine (XYLOCAINE) 2 % solution Use as directed 15 mLs in the mouth or throat every 3 (three) hours as needed for mouth pain (swish and spit). 05/09/22  Yes Eusebio Friendly B, PA-C  losartan (COZAAR) 100 MG tablet Take 100 mg by mouth daily. 03/25/17  Yes [provider]  metFORMIN (GLUCOPHAGE) 1000 MG tablet Take 1,000 mg by mouth 2 (two) times daily with a meal.   Yes [provider]  Multiple  Vitamin (MULTI-VITAMIN) tablet Take 1 tablet by mouth daily.   Yes [provider]  NIFEdipine (ADALAT CC) 60 MG 24 hr tablet Take 60 mg by mouth daily. 08/22/20  Yes [provider]  nitroGLYCERIN (NITROSTAT) 0.4 MG SL tablet Place 0.4 mg under the tongue every 5 (five) minutes as needed. 03/02/19  Yes [provider]  pantoprazole (PROTONIX) 40 MG tablet Take 40 mg by mouth daily. 02/05/17  Yes [provider]  pregabalin (LYRICA) 75 MG capsule Take 75 mg by mouth 2 (two) times daily.   Yes [provider]  promethazine-dextromethorphan (PROMETHAZINE-DM) 6.25-15 MG/5ML syrup Take 5 mLs by mouth 4 (four) times daily as needed. 05/09/22  Yes Shirlee Latch, PA-C  traZODone (DESYREL) 150 MG tablet Take 150 mg by mouth at bedtime.   Yes [provider]  vitamin B-12 (CYANOCOBALAMIN) 100 MCG tablet Take 100 mcg by mouth daily.   Yes [provider]  atorvastatin (LIPITOR) 40 MG tablet Take 40 mg by mouth daily.    [provider]  gabapentin (NEURONTIN) 400 MG capsule Take 2 capsules (800 mg total) by mouth 3 (three) times daily. 09/08/20   Alford Highland, MD  levETIRAcetam (KEPPRA) 750 MG tablet Take 1 tablet (750 mg total) by mouth 2 (two) times daily. 09/08/20   Alford Highland, MD  magnesium oxide (MAG-OX) 400 MG tablet Take 400 mg by mouth daily.    [provider]  meclizine (ANTIVERT) 12.5 MG tablet Take 1 tablet (12.5 mg total) by mouth 3 (three) times daily as needed for dizziness. 09/08/20   Alford Highland, MD  TRULICITY 0.75 MG/0.5ML SOPN Inject into the skin once a week.    [provider]    Family History Family History  Problem Relation Age of Onset   Diabetes Mellitus II Father    CAD Father    Diabetes Mellitus II Sister    CAD Sister     Social History Social History   Tobacco Use   Smoking status: Former    Years: 40.00    Types: Cigarettes   Smokeless tobacco: Never   Tobacco  comments:    quit 07/2020  Vaping Use   Vaping Use: Never used  Substance Use Topics   Alcohol use: Never   Drug use: Never     Allergies   Erythromycin, Codeine, Paroxetine, Sertraline, Wellbutrin [bupropion], and Buspirone   Review of Systems Review of Systems  Constitutional:  Positive for fatigue. Negative for chills, diaphoresis and fever.  HENT:  Positive for congestion, rhinorrhea, sinus pressure and sore throat. Negative for ear pain.   Respiratory:  Positive for cough. Negative for shortness  of breath.   Cardiovascular:  Negative for chest pain.  Gastrointestinal:  Negative for abdominal pain, nausea and vomiting.  Musculoskeletal:  Negative for arthralgias and myalgias.  Skin:  Negative for rash.  Neurological:  Negative for weakness and headaches.  Hematological:  Negative for adenopathy.     Physical Exam Triage Vital Signs ED Triage Vitals  Enc Vitals Group     BP      Pulse      Resp      Temp      Temp src      SpO2      Weight      Height      Head Circumference      Peak Flow      Pain Score      Pain Loc      Pain Edu?      Excl. in [place]?    No data found.  Updated Vital Signs BP (!) 149/75 (BP Location: Left Arm)   Pulse 69   Temp 98 F (36.7 C) (Oral)   Resp 18   Ht 5\' 2"  (1.575 m)   Wt 156 lb 1.4 oz (70.8 kg)   SpO2 93%   BMI 28.55 kg/m        Physical Exam Vitals and nursing note reviewed.  Constitutional:      General: She is not in acute distress.    Appearance: Normal appearance. She is not ill-appearing or toxic-appearing.  HENT:     Head: Normocephalic and atraumatic.     Nose: Congestion present.     Mouth/Throat:     Mouth: Mucous membranes are moist.     Pharynx: Oropharynx is clear. Posterior oropharyngeal erythema present.  Eyes:     General: No scleral icterus.       Right eye: No discharge.        Left eye: No discharge.     Conjunctiva/sclera: Conjunctivae normal.  Cardiovascular:     Rate and Rhythm:  Normal rate and regular rhythm.     Heart sounds: Normal heart sounds.  Pulmonary:     Effort: Pulmonary effort is normal. No respiratory distress.     Breath sounds: Normal breath sounds.  Musculoskeletal:     Cervical back: Neck supple.  Skin:    General: Skin is dry.  Neurological:     General: No focal deficit present.     Mental Status: She is alert. Mental status is at baseline.     Motor: No weakness.     Gait: Gait normal.  Psychiatric:        Mood and Affect: Mood normal.        Behavior: Behavior normal.        Thought Content: Thought content normal.      UC Treatments / Results  Labs (all labs ordered are listed, but only abnormal results are displayed) Labs Reviewed - No data to display  EKG   Radiology DG Chest 2 View  Result Date: 05/09/2022 CLINICAL DATA:  Cough and congestion for 3 weeks.  Fever. EXAM: CHEST - 2 VIEW COMPARISON:  One-view chest x-ray 09/07/2020. Two-view chest x-ray 09/05/2020. FINDINGS: Minimal airspace opacities are present at the left base, similar the prior exam. This likely reflects atelectasis or scarring. No other significant airspace disease is present. The heart size is normal. Atherosclerotic changes are present at the arch. The visualized soft tissues and bony thorax are within normal limits. IMPRESSION: 1. Minimal left basilar airspace disease  likely reflects atelectasis or scarring. 2. No acute cardiopulmonary disease. Electronically Signed   By: San Morelle M.D.   On: 05/09/2022 15:35    Procedures Procedures (including critical care time)  Medications Ordered in UC Medications - No data to display  Initial Impression / Assessment and Plan / UC Course  I have reviewed the triage vital signs and the nursing notes.  Pertinent labs & imaging results that were available during my care of the patient were reviewed by me and considered in my medical decision making (see chart for details).   67 year old female presenting  for 3-week history of cough, congestion, sore throat and postnasal drainage.  Fever at onset but fever broke a week and a half ago.  Symptoms have not gotten any better or worse from onset.  History of COPD.  She is afebrile.  She is overall well-appearing.  In no acute distress.  On exam she has nasal congestion present as well as erythema posterior pharynx without swelling.  Chest clear to auscultation.  Chest x-ray performed to assess for possible underlying pneumonia.  X-ray does not show any acute abnormalities which I discussed with patient.  Advised patient symptoms most likely due to continued viral illness.  However since she has history of COPD, will cover for any possible bacterial causes with Augmentin.  Also sent viscous lidocaine and Promethazine DM.  Encouraged plenty rest and fluids.  Reviewed return if new onset of fever or increased shortness of breath or weakness.   Final Clinical Impressions(s) / UC Diagnoses   Final diagnoses:  Acute sinusitis, recurrence not specified, unspecified location  Acute cough  Sore throat   Discharge Instructions   None    ED Prescriptions     Medication Sig Dispense Auth. Provider   amoxicillin-clavulanate (AUGMENTIN) 875-125 MG tablet Take 1 tablet by mouth every 12 (twelve) hours for 7 days. 14 tablet Laurene Footman B, PA-C   lidocaine (XYLOCAINE) 2 % solution Use as directed 15 mLs in the mouth or throat every 3 (three) hours as needed for mouth pain (swish and spit). 100 mL Laurene Footman B, PA-C   promethazine-dextromethorphan (PROMETHAZINE-DM) 6.25-15 MG/5ML syrup Take 5 mLs by mouth 4 (four) times daily as needed. 118 mL Danton Clap, PA-C      PDMP not reviewed this encounter.   Danton Clap, PA-C 05/09/22 [phone]

## 2022-05-17 IMAGING — CT CT HEAD CODE STROKE
3 series · 15 of 47 positions shown, 18 images · non-contrast
Comparison: None.

CLINICAL DATA: Code stroke. Neuro deficit, acute, stroke suspected.
Sudden onset of headache. Personal history of stroke and TIA.

EXAM:
CT HEAD WITHOUT CONTRAST
TECHNIQUE: Contiguous axial images were obtained from the base of the skull
through the vertex without intravenous contrast.

[Series 3: head wo · axial · 0.45mm/px · z∈[-128,-3]mm · 9 of 31 slices shown, 12 images]
[im 3/31  brain]
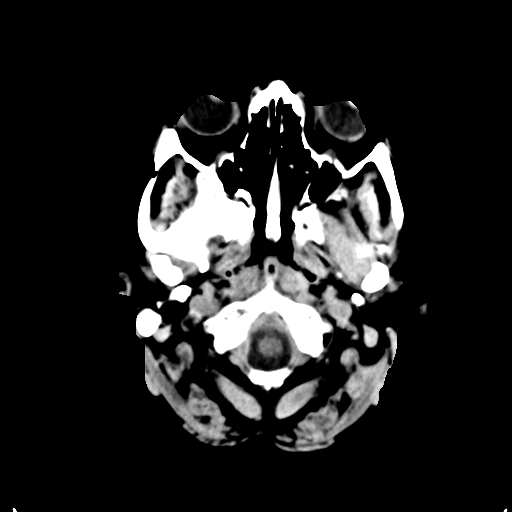
[im 3/31  bone]
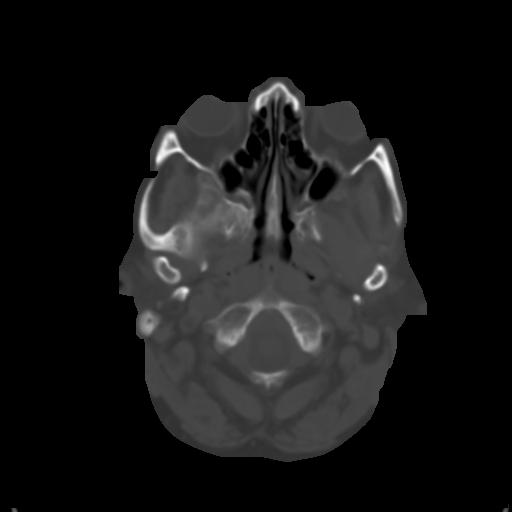
[im 6/31  brain]
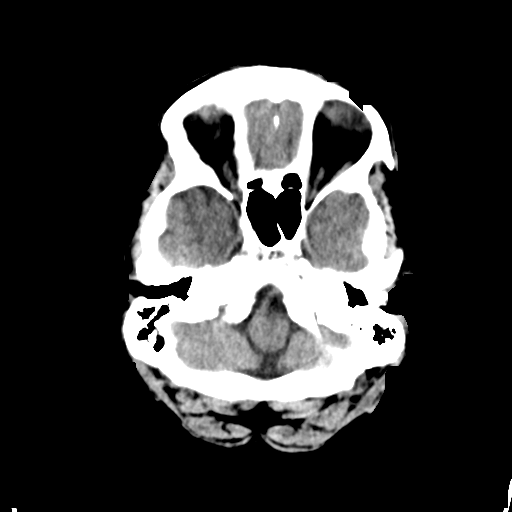
[im 9/31  brain]
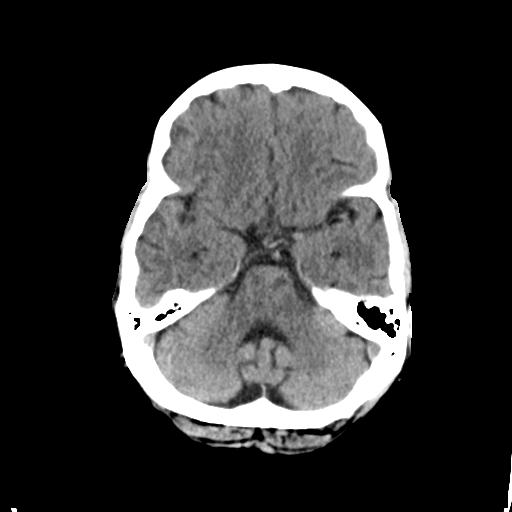
[im 12/31  brain]
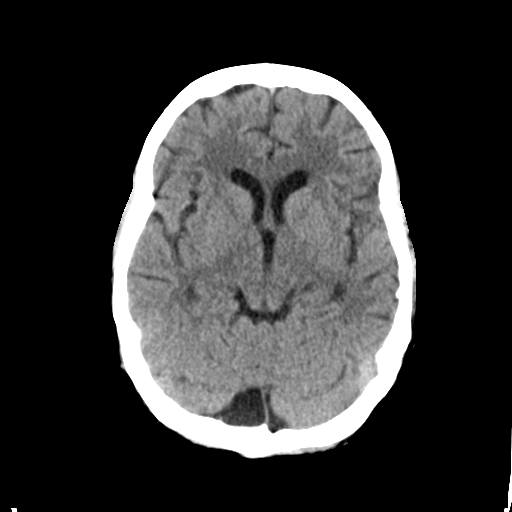
[im 16/31  brain]
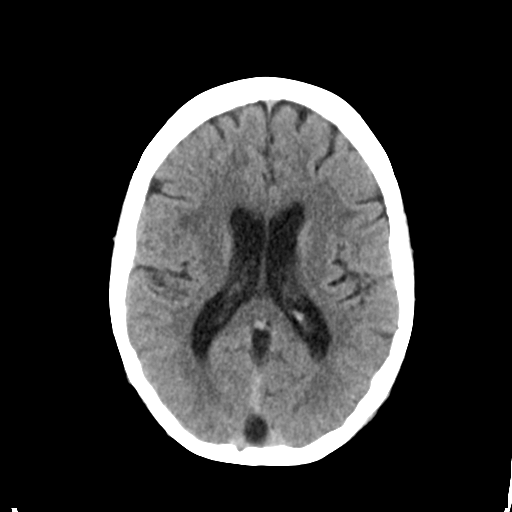
[im 16/31  bone]
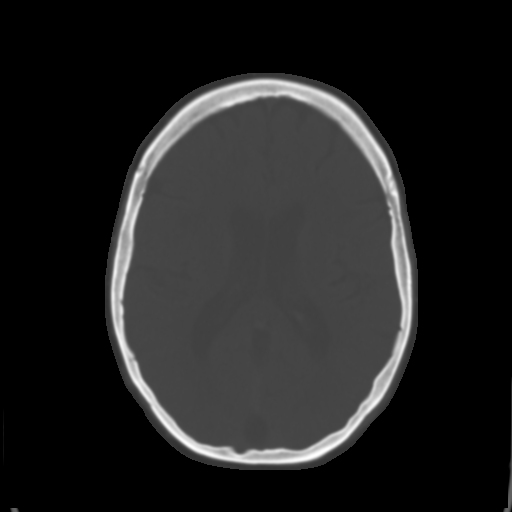
[im 19/31  brain]
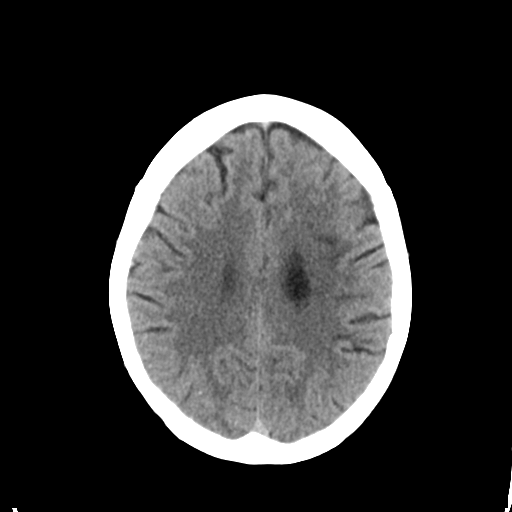
[im 22/31  brain]
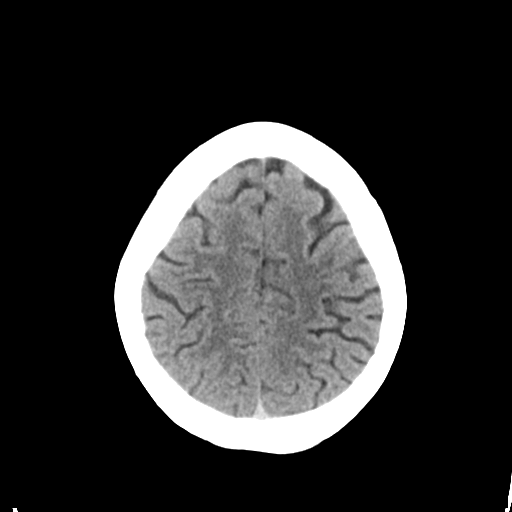
[im 25/31  brain]
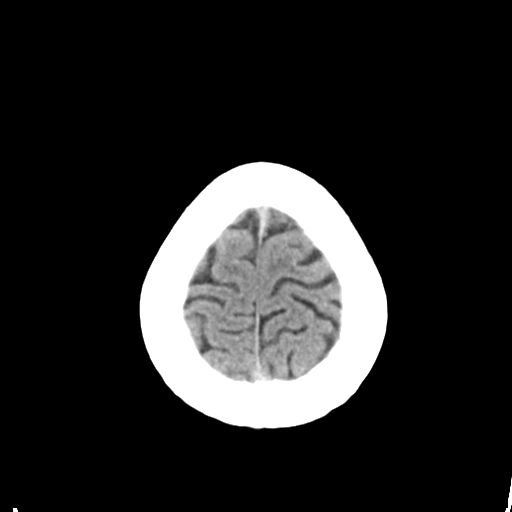
[im 28/31  brain]
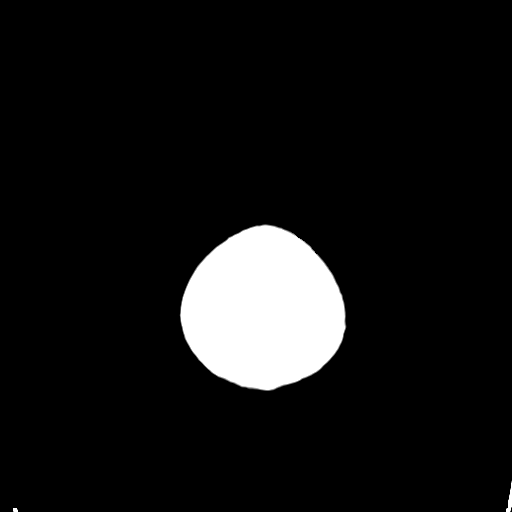
[im 28/31  bone]
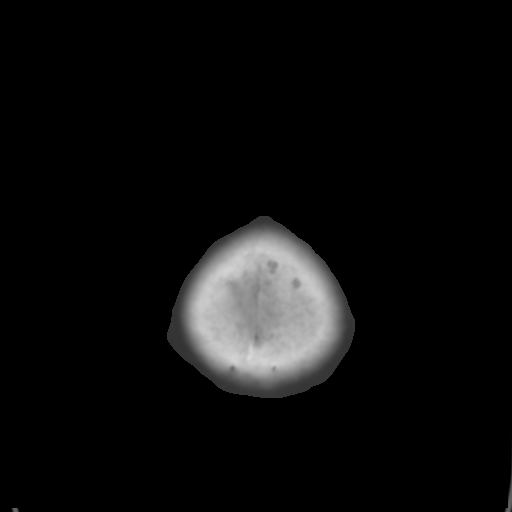

[Series 5: coronal soft tissue · coronal · 0.32mm/px · 3 of 68 slices shown]
[im 23/68  brain]
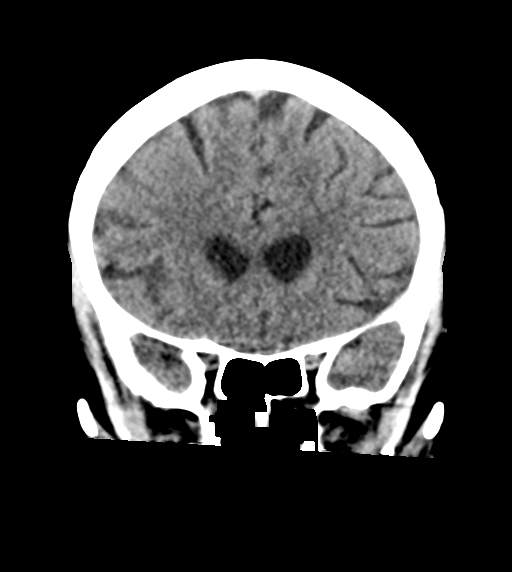
[im 30/68  brain]
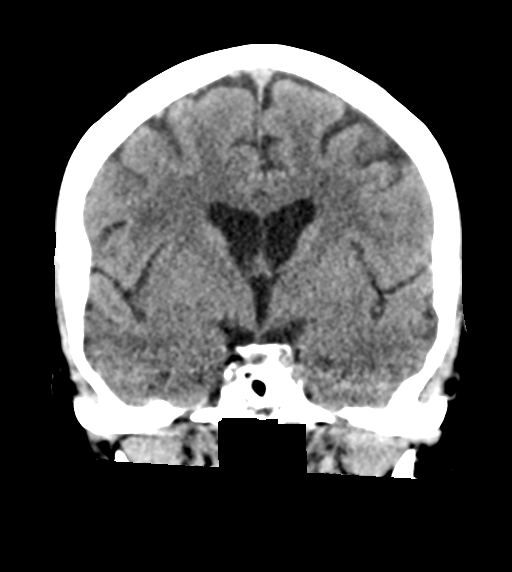
[im 38/68  brain]
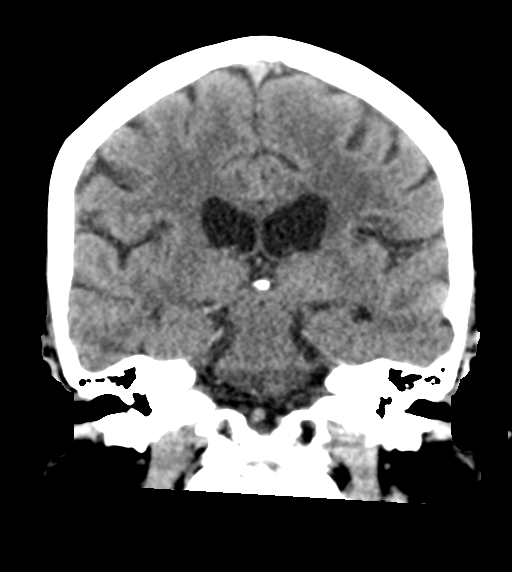

[Series 6: sagittal soft tissue · sagittal · 0.35mm/px · 3 of 58 slices shown]
[im 20/58  brain]
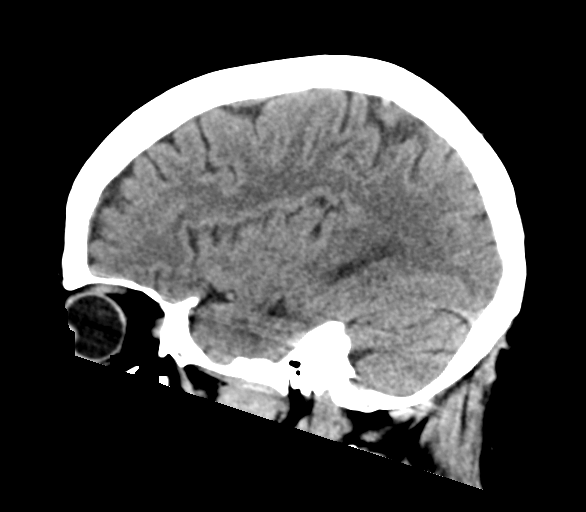
[im 29/58  brain]
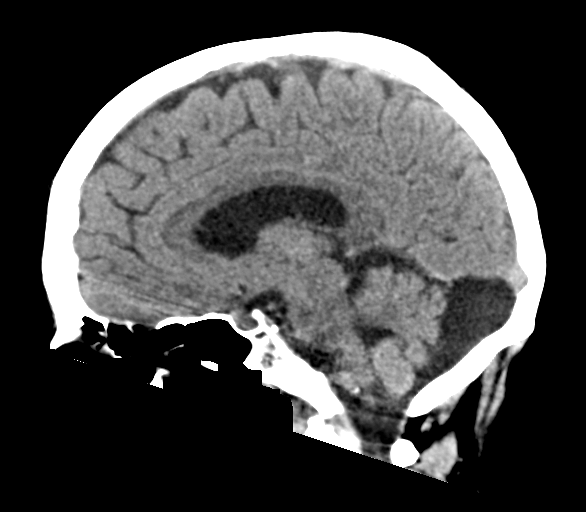
[im 39/58  brain]
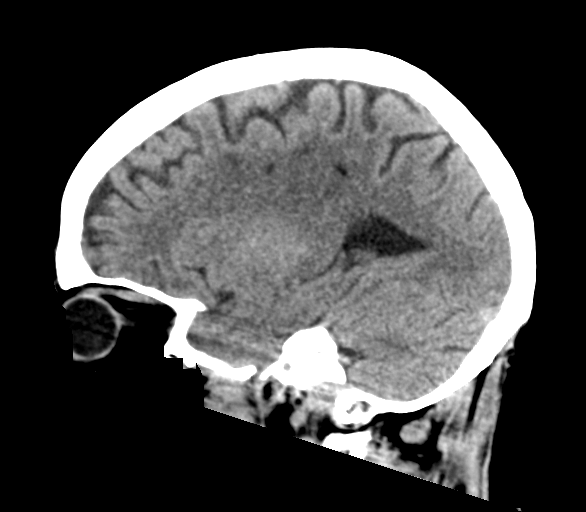

[15 of 47 positions shown; findings below may reference images not displayed]

FINDINGS: Brain: Subcortical white matter changes are more prominent left than
right. No acute infarct, hemorrhage, or mass lesion is present.
Basal ganglia are intact. Insular ribbon is normal. No acute or
focal cortical abnormality is present. Posterior fossa arachnoid
cyst noted. The brainstem and cerebellum are otherwise normal.

The ventricles are of normal size. No significant extraaxial fluid
collection is present.

Vascular: Atherosclerotic changes are present within the cavernous
internal carotid arteries bilaterally. Calcifications are also
present at the dural margin of the vertebral arteries. No hyperdense
vessel is present.

Skull: Calvarium is intact. No focal lytic or blastic lesions are
present. No significant extracranial soft tissue lesion is present.

Sinuses/Orbits: The paranasal sinuses and mastoid air cells are
clear. The globes and orbits are within normal limits.

ASPECTS (Alberta Stroke Program Early CT Score)

- Ganglionic level infarction (caudate, lentiform nuclei, internal
capsule, insula, M1-M3 cortex): [DATE]

- Supraganglionic infarction (M4-M6 cortex): [DATE]

Total score (0-10 with 10 being normal): [DATE]
IMPRESSION: 1. Subcortical white matter changes are more prominent left than
right. This likely reflects the sequela of chronic microvascular
ischemia.
2. No acute intracranial abnormality.
3. ASPECTS is [DATE].

These results were called by telephone at the time of interpretation
on 09/04/2020 at [DATE] to provider Dr. Magdiel , who verbally
acknowledged these results.

## 2022-05-18 IMAGING — CR DG CHEST 2V
2 series · 2 of 2 positions shown · non-contrast
Comparison: None.

CLINICAL DATA: Shortness of breath. Sudden onset headache, weakness
and confusion.

EXAM:
CHEST - 2 VIEW

[chest lat]
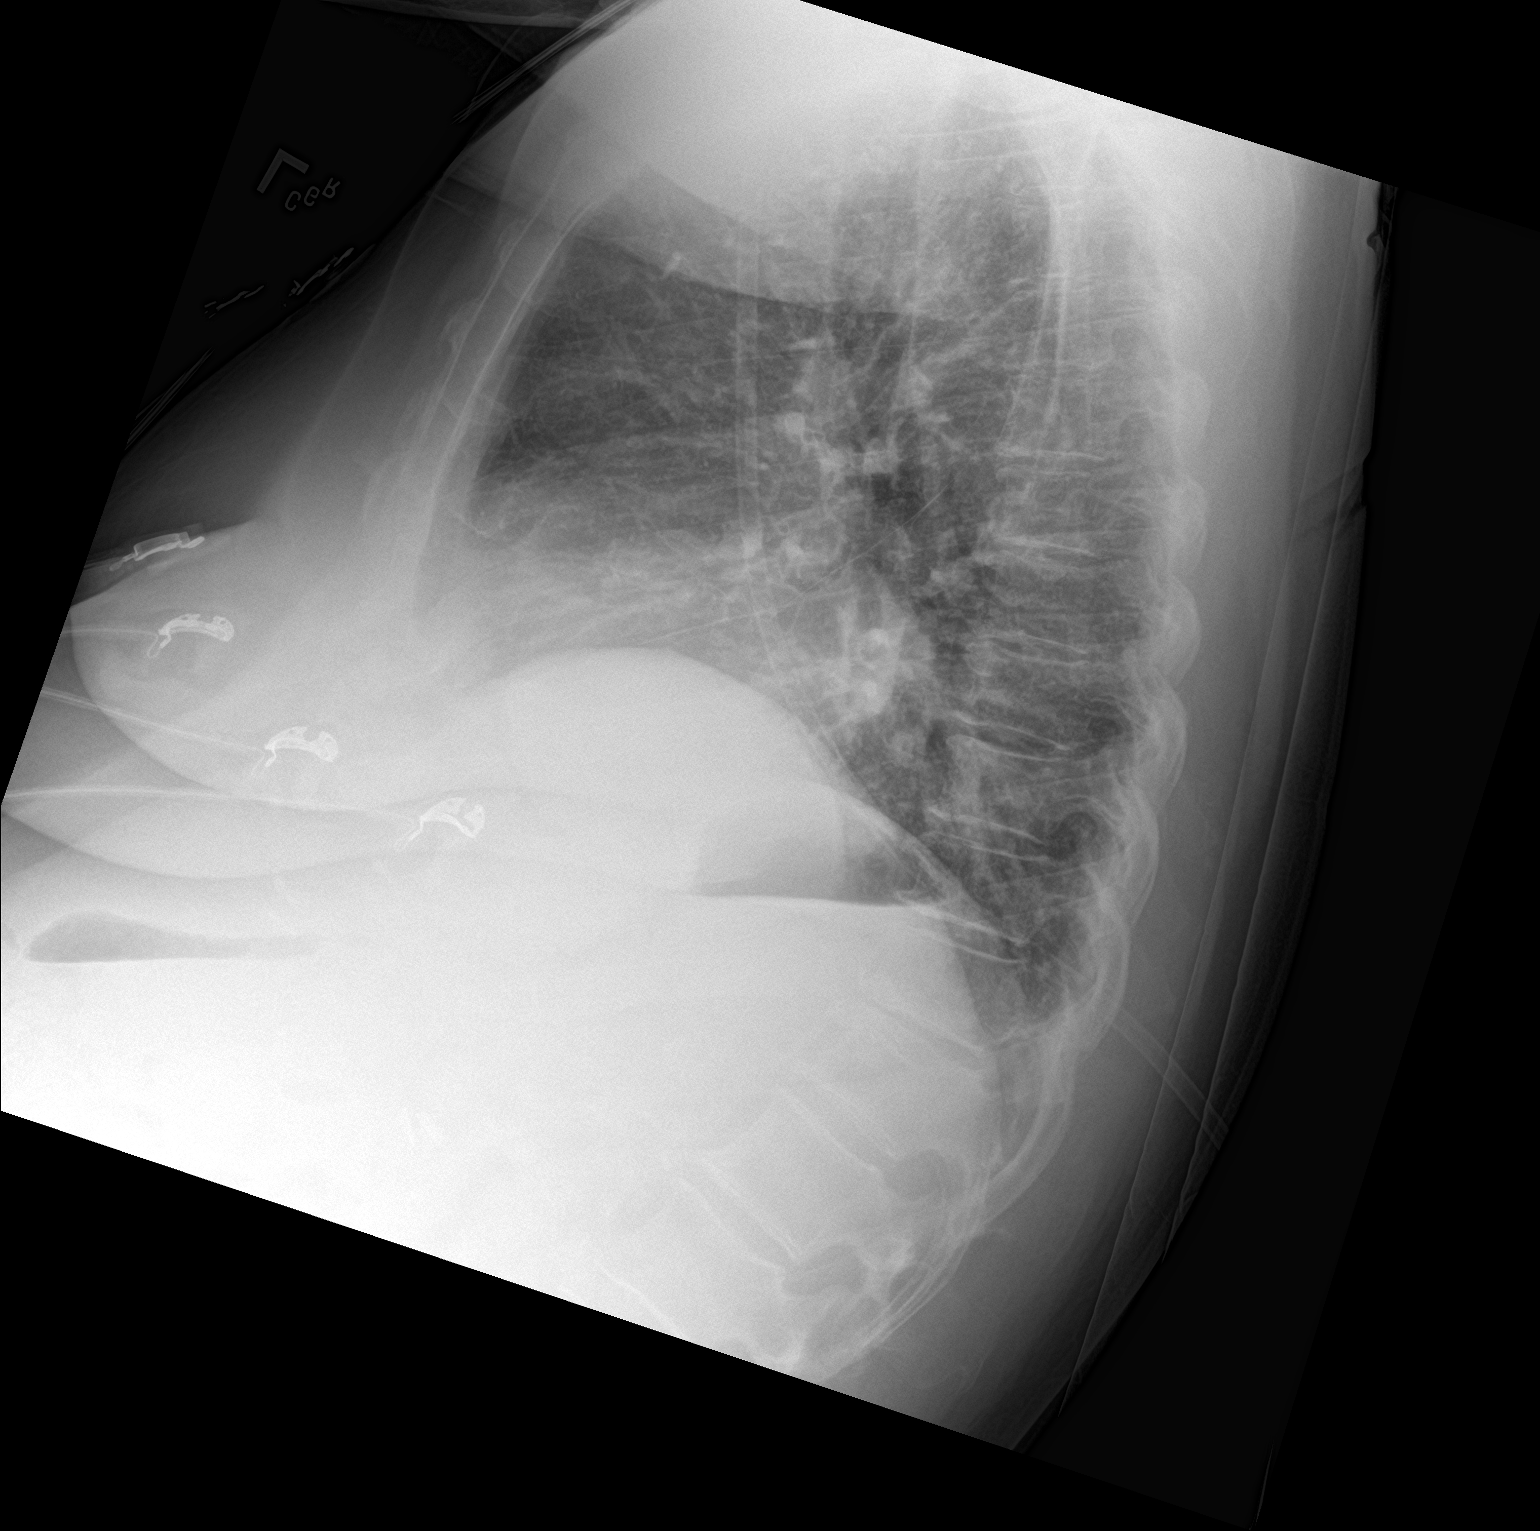

[chest ap]
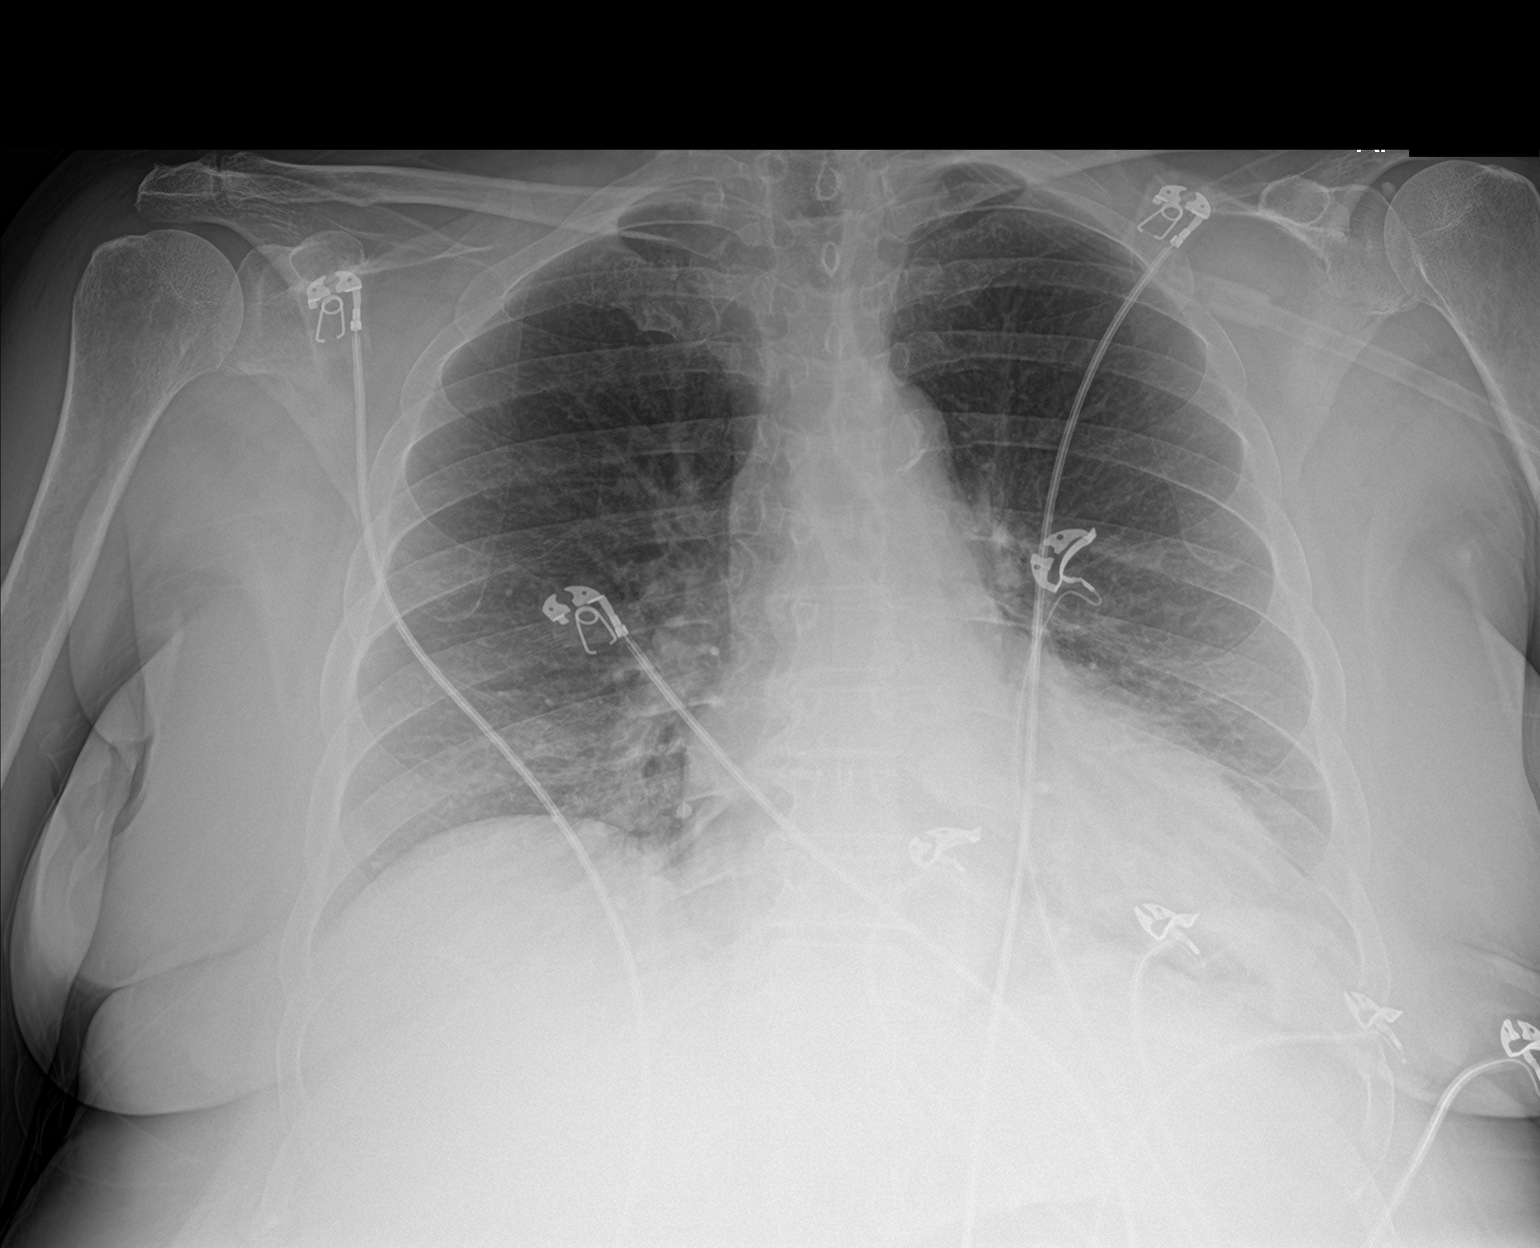

[2 of 2 positions shown; findings below may reference images not displayed]

FINDINGS: Trachea is midline. Heart is at the upper limits of normal in size
to mildly enlarged. Thoracic aorta is calcified. Mild basilar
streaky opacification, left greater than right. No dense airspace
consolidation or pleural fluid. Degenerative changes in the spine.
IMPRESSION: 1. Mild bibasilar atelectasis or scarring.
2.  Aortic atherosclerosis ([id number]).

## 2022-05-20 IMAGING — DX DG CHEST 1V PORT
1 series · 1 of 1 positions shown · non-contrast
Comparison: September 05, 2020

CLINICAL DATA: Fever

EXAM:
PORTABLE CHEST 1 VIEW

[chest ap]
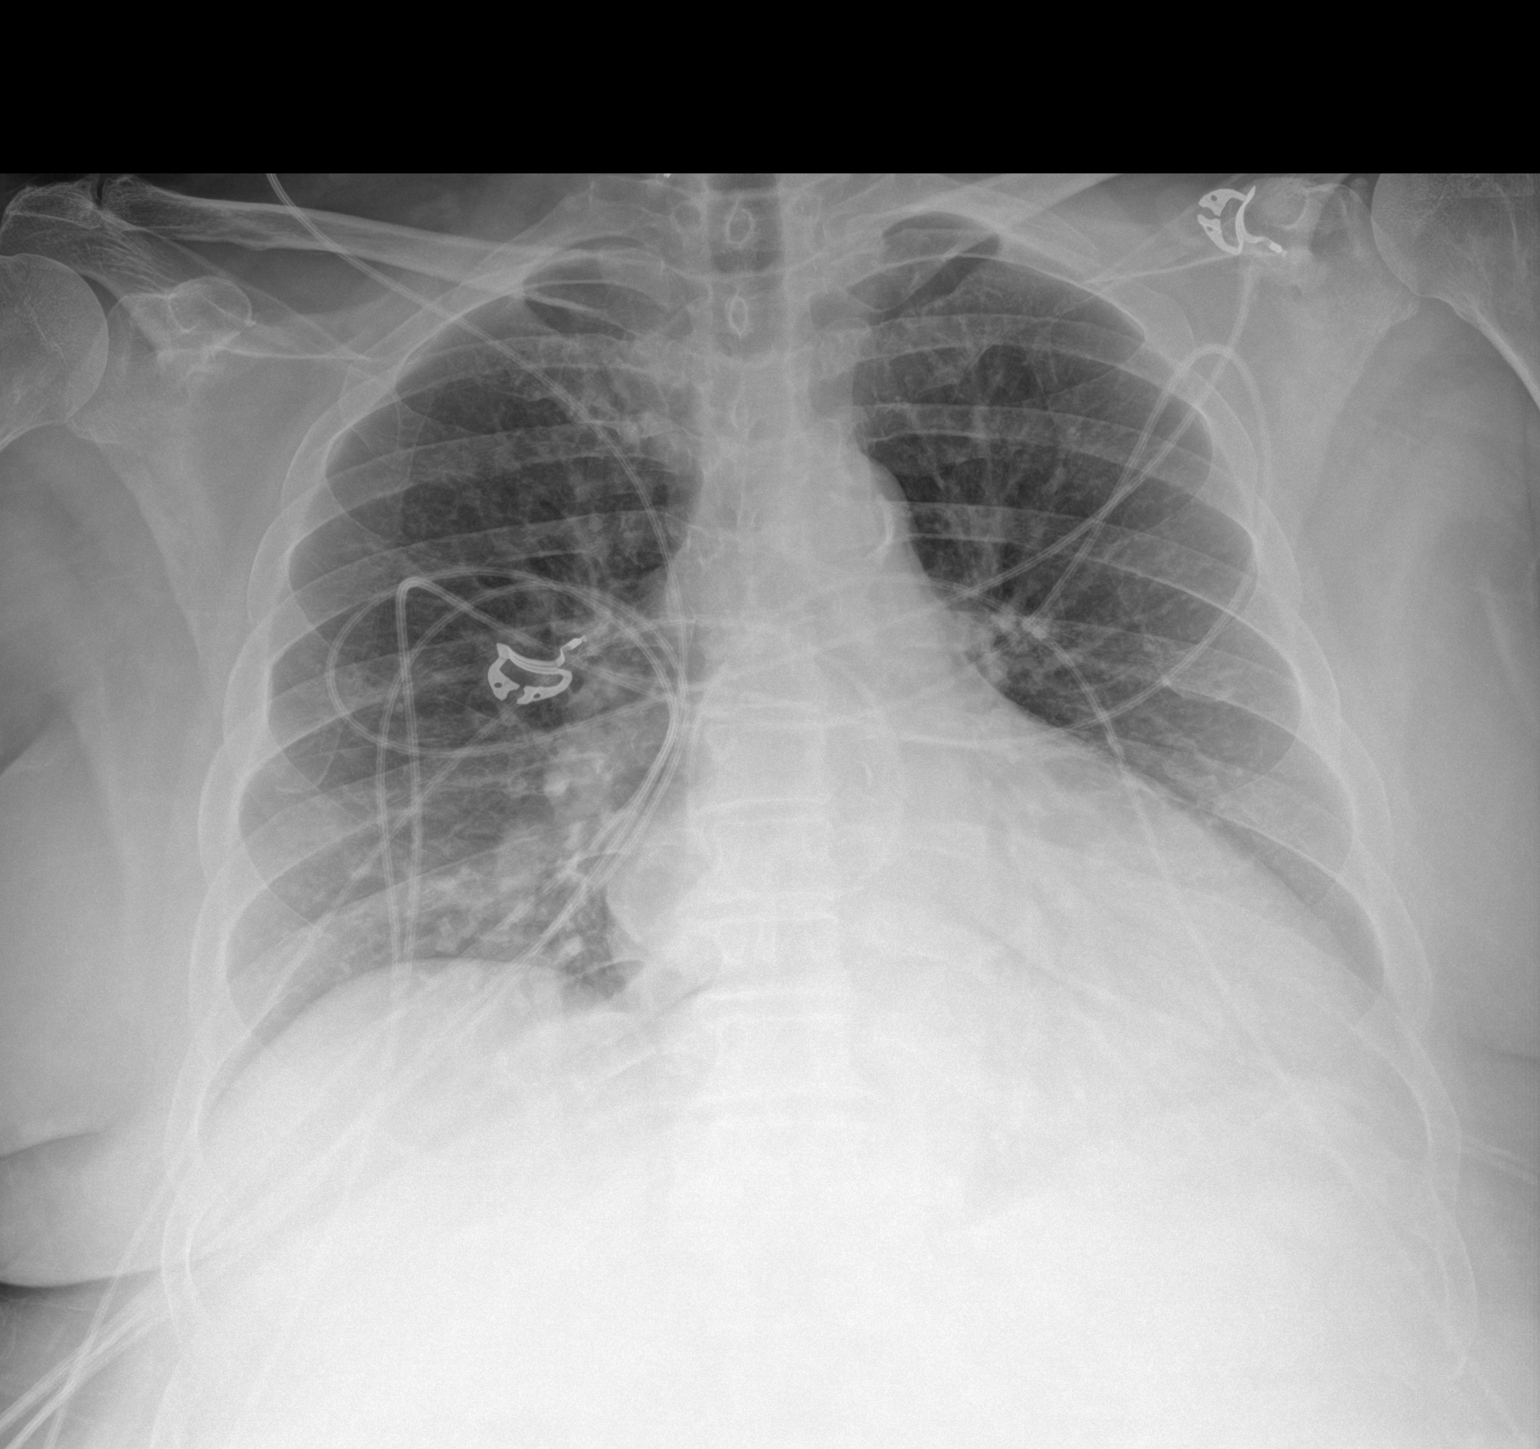

[1 of 1 positions shown; findings below may reference images not displayed]

FINDINGS: Lungs are clear. There is cardiomegaly with pulmonary vascularity
normal. No adenopathy. There is aortic atherosclerosis. No bone
lesions.
IMPRESSION: Cardiomegaly.  Lungs clear. Aortic Atherosclerosis ([id number]).

## 2023-07-30 DEATH — deceased
